# Patient Record
Sex: Female | Born: 1960 | Race: Black or African American | Hispanic: No | Marital: Single | Smoking: Never smoker
Health system: Southern US, Community
[De-identification: ages and names within clinical notes are randomized; demographics above are authoritative.]

## PROBLEM LIST (undated history)

## (undated) DIAGNOSIS — M1990 Unspecified osteoarthritis, unspecified site: Secondary | ICD-10-CM

## (undated) DIAGNOSIS — J449 Chronic obstructive pulmonary disease, unspecified: Secondary | ICD-10-CM

## (undated) DIAGNOSIS — G809 Cerebral palsy, unspecified: Secondary | ICD-10-CM

## (undated) DIAGNOSIS — R569 Unspecified convulsions: Secondary | ICD-10-CM

## (undated) HISTORY — PX: OTHER SURGICAL HISTORY: SHX169

## (undated) HISTORY — PX: NO PAST SURGERIES: SHX2092

---

## 2010-01-02 ENCOUNTER — Ambulatory Visit: Payer: Self-pay | Admitting: Family Medicine

## 2010-06-27 ENCOUNTER — Inpatient Hospital Stay: Payer: Self-pay | Admitting: Internal Medicine

## 2010-07-10 ENCOUNTER — Inpatient Hospital Stay: Payer: Self-pay | Admitting: Internal Medicine

## 2016-09-28 ENCOUNTER — Emergency Department: Payer: Medicare Other

## 2016-09-28 ENCOUNTER — Inpatient Hospital Stay
Admission: EM | Admit: 2016-09-28 | Discharge: 2016-10-04 | DRG: 871 | Disposition: A | Discharge status: Home Health | Payer: Medicare Other | Attending: Internal Medicine | Admitting: Internal Medicine

## 2016-09-28 DIAGNOSIS — A419 Sepsis, unspecified organism: Principal | ICD-10-CM | POA: Diagnosis present

## 2016-09-28 DIAGNOSIS — Z833 Family history of diabetes mellitus: Secondary | ICD-10-CM

## 2016-09-28 DIAGNOSIS — Z8249 Family history of ischemic heart disease and other diseases of the circulatory system: Secondary | ICD-10-CM

## 2016-09-28 DIAGNOSIS — Z681 Body mass index (BMI) 19 or less, adult: Secondary | ICD-10-CM

## 2016-09-28 DIAGNOSIS — L89121 Pressure ulcer of left upper back, stage 1: Secondary | ICD-10-CM | POA: Diagnosis present

## 2016-09-28 DIAGNOSIS — G800 Spastic quadriplegic cerebral palsy: Secondary | ICD-10-CM | POA: Diagnosis present

## 2016-09-28 DIAGNOSIS — E876 Hypokalemia: Secondary | ICD-10-CM | POA: Diagnosis present

## 2016-09-28 DIAGNOSIS — E870 Hyperosmolality and hypernatremia: Secondary | ICD-10-CM | POA: Diagnosis present

## 2016-09-28 DIAGNOSIS — R4182 Altered mental status, unspecified: Secondary | ICD-10-CM | POA: Diagnosis present

## 2016-09-28 DIAGNOSIS — M2450 Contracture, unspecified joint: Secondary | ICD-10-CM | POA: Diagnosis present

## 2016-09-28 DIAGNOSIS — R52 Pain, unspecified: Secondary | ICD-10-CM | POA: Diagnosis not present

## 2016-09-28 DIAGNOSIS — R509 Fever, unspecified: Secondary | ICD-10-CM

## 2016-09-28 DIAGNOSIS — G40909 Epilepsy, unspecified, not intractable, without status epilepticus: Secondary | ICD-10-CM | POA: Diagnosis present

## 2016-09-28 DIAGNOSIS — L8990 Pressure ulcer of unspecified site, unspecified stage: Secondary | ICD-10-CM | POA: Insufficient documentation

## 2016-09-28 DIAGNOSIS — K8020 Calculus of gallbladder without cholecystitis without obstruction: Secondary | ICD-10-CM

## 2016-09-28 DIAGNOSIS — K829 Disease of gallbladder, unspecified: Secondary | ICD-10-CM | POA: Diagnosis present

## 2016-09-28 DIAGNOSIS — I959 Hypotension, unspecified: Secondary | ICD-10-CM | POA: Diagnosis present

## 2016-09-28 DIAGNOSIS — R0682 Tachypnea, not elsewhere classified: Secondary | ICD-10-CM | POA: Diagnosis present

## 2016-09-28 DIAGNOSIS — J449 Chronic obstructive pulmonary disease, unspecified: Secondary | ICD-10-CM | POA: Diagnosis present

## 2016-09-28 DIAGNOSIS — E43 Unspecified severe protein-calorie malnutrition: Secondary | ICD-10-CM | POA: Insufficient documentation

## 2016-09-28 HISTORY — DX: Chronic obstructive pulmonary disease, unspecified: J44.9

## 2016-09-28 HISTORY — DX: Unspecified osteoarthritis, unspecified site: M19.90

## 2016-09-28 HISTORY — DX: Unspecified convulsions: R56.9

## 2016-09-28 LAB — URINALYSIS, ROUTINE W REFLEX MICROSCOPIC
BACTERIA UA: NONE SEEN
Glucose, UA: NEGATIVE mg/dL
HGB URINE DIPSTICK: NEGATIVE
Ketones, ur: NEGATIVE mg/dL
Leukocytes, UA: NEGATIVE
NITRITE: NEGATIVE
PH: 5 (ref 5.0–8.0)
Protein, ur: 100 mg/dL — AB

## 2016-09-28 LAB — COMPREHENSIVE METABOLIC PANEL
ALBUMIN: 3.7 g/dL (ref 3.5–5.0)
ALT: 18 U/L (ref 14–54)
AST: 25 U/L (ref 15–41)
Alkaline Phosphatase: 80 U/L (ref 38–126)
Anion gap: 10 (ref 5–15)
BUN: 15 mg/dL (ref 6–20)
CHLORIDE: 116 mmol/L — AB (ref 101–111)
CO2: 23 mmol/L (ref 22–32)
CREATININE: 0.45 mg/dL (ref 0.44–1.00)
Calcium: 9 mg/dL (ref 8.9–10.3)
GFR calc Af Amer: >60 mL/min (ref >60)
GLUCOSE: 109 mg/dL — AB (ref 65–99)
Potassium: 3.3 mmol/L — ABNORMAL LOW (ref 3.5–5.1)
Sodium: 149 mmol/L — ABNORMAL HIGH (ref 135–145)
Total Bilirubin: 0.7 mg/dL (ref 0.3–1.2)
Total Protein: 8.1 g/dL (ref 6.5–8.1)

## 2016-09-28 LAB — CBC WITH DIFFERENTIAL/PLATELET
BASOS PCT: 1 %
Basophils Absolute: 0.1 10*3/uL (ref 0–0.1)
Eosinophils Absolute: 0.3 10*3/uL (ref 0–0.7)
Eosinophils Relative: 3 %
HEMATOCRIT: 39.2 % (ref 35.0–47.0)
HEMOGLOBIN: 13.4 g/dL (ref 12.0–16.0)
LYMPHS ABS: 1.7 10*3/uL (ref 1.0–3.6)
LYMPHS PCT: 17 %
MCH: 31.1 pg (ref 26.0–34.0)
MCHC: 34.1 g/dL (ref 32.0–36.0)
MCV: 91.4 fL (ref 80.0–100.0)
MONO ABS: 1.2 10*3/uL — AB (ref 0.2–0.9)
MONOS PCT: 12 %
NEUTROS ABS: 6.8 10*3/uL — AB (ref 1.4–6.5)
Neutrophils Relative %: 67 %
Platelets: 224 10*3/uL (ref 150–440)
RBC: 4.29 MIL/uL (ref 3.80–5.20)
RDW: 14.2 % (ref 11.5–14.5)
WBC: 10 10*3/uL (ref 3.6–11.0)

## 2016-09-28 LAB — PROTIME-INR
INR: 1.07
Prothrombin Time: 13.8 seconds (ref 11.4–15.2)

## 2016-09-28 LAB — TROPONIN I: Troponin I: <0.03 ng/mL (ref <0.03)

## 2016-09-28 LAB — PROCALCITONIN: Procalcitonin: 0.23 ng/mL

## 2016-09-28 LAB — LACTIC ACID, PLASMA: LACTIC ACID, VENOUS: 0.8 mmol/L (ref 0.5–1.9)

## 2016-09-28 LAB — LIPASE, BLOOD: LIPASE: 20 U/L (ref 11–51)

## 2016-09-28 MED ORDER — IOPAMIDOL (ISOVUE-300) INJECTION 61%
75.0000 mL | Freq: Once | INTRAVENOUS | Status: AC | PRN
  Administered 2016-09-28: 60 mL via INTRAVENOUS

## 2016-09-28 MED ORDER — PIPERACILLIN-TAZOBACTAM 3.375 G IVPB 30 MIN
3.3750 g | Freq: Once | INTRAVENOUS | Status: AC
  Administered 2016-09-28: 3.375 g via INTRAVENOUS

## 2016-09-28 MED ORDER — VANCOMYCIN HCL 500 MG IV SOLR
500.0000 mg | Freq: Once | INTRAVENOUS | Status: AC
  Administered 2016-09-28: 500 mg via INTRAVENOUS
  Filled 2016-09-28: qty 500

## 2016-09-28 MED ORDER — PIPERACILLIN-TAZOBACTAM 3.375 G IVPB 30 MIN
INTRAVENOUS | Status: AC
  Filled 2016-09-28: qty 50

## 2016-09-28 MED ORDER — SODIUM CHLORIDE 0.9 % IV BOLUS (SEPSIS)
1000.0000 mL | Freq: Once | INTRAVENOUS | Status: AC
  Administered 2016-09-28: 1000 mL via INTRAVENOUS

## 2016-09-28 MED ORDER — FENTANYL CITRATE (PF) 100 MCG/2ML IJ SOLN
50.0000 ug | Freq: Once | INTRAMUSCULAR | Status: AC
  Administered 2016-09-28: 50 ug via INTRAVENOUS
  Filled 2016-09-28: qty 2

## 2016-09-28 NOTE — Progress Notes (Signed)
Pharmacy Antibiotic Note  Roberta Ramos is a 56 y.o. female admitted on 09/28/2016 with sepsis.  Pharmacy has been consulted for vancomycin dosing.  Plan: Vancomycin 500 mg IV x 1 in ED. Waiting for serum creatinine/CrCl and admission orders.  Height: 5\' 3"  (160 cm) Weight: 55 lb (24.9 kg) IBW/kg (Calculated) : 52.4  No data recorded.  No results for input(s): WBC, CREATININE, LATICACIDVEN, VANCOTROUGH, VANCOPEAK, VANCORANDOM, GENTTROUGH, GENTPEAK, GENTRANDOM, TOBRATROUGH, TOBRAPEAK, TOBRARND, AMIKACINPEAK, AMIKACINTROU, AMIKACIN in the last 168 hours.  CrCl cannot be calculated (No order found.).    Not on File  Thank you for allowing pharmacy to be a part of this patient's care.  Laural Benes, Pharm.D., BCPS Clinical Pharmacist 09/28/2016 9:45 PM

## 2016-09-28 NOTE — ED Notes (Signed)
Pt in CT.

## 2016-09-28 NOTE — ED Triage Notes (Signed)
Pt arrived POV with family members. Sister reports fever at [place] office and Dr. Michela Pitcher that she "didnt like the sound of her stomach". Pt is non-verbal. Family at the bedside.

## 2016-09-28 NOTE — ED Provider Notes (Signed)
[facility] Emergency Department Provider Note  ____________________________________________   First MD Initiated Contact with Patient 09/28/16 2137     (approximate)  I have reviewed the triage vital signs and the nursing notes.   HISTORY  Chief Complaint Fever  Level V exemption history Limited by the patient's altered mental status   HPI Roberta Ramos is a 56 y.o. female is brought to the emergency department by her family for fever and "not acting right". She became febrile 2 days ago up to 102. Family brought the patient to her primary care physician today who recommended he come to the emergency department for evaluation of abdominal pain. The patient has not had any diarrhea. She is not behaving normally according to family. She has a long-standing history of cerebral palsy as well as COPD and seizure disorder.   Past Medical History:  Diagnosis Date  . Arthritis   . COPD (chronic obstructive pulmonary disease) ([place])   . Seizures ([place])     There are no active problems to display for this patient.   History reviewed. No pertinent surgical history.  Prior to Admission medications   Not on File    Allergies Patient has no allergy information on record.  History reviewed. No pertinent family history.  Social History Social History  Substance Use Topics  . Smoking status: Never Smoker  . Smokeless tobacco: Never Used  . Alcohol use No    Review of Systems Level V exemption history Limited by the patient's clinical condition ____________________________________________   PHYSICAL EXAM:  VITAL SIGNS: ED Triage Vitals  Enc Vitals Group     BP 09/28/16 2036 (!) 167/128     Pulse Rate 09/28/16 2036 (!) 136     Resp 09/28/16 2036 19     Temp --      Temp src --      SpO2 09/28/16 2036 97 %     Weight 09/28/16 2040 55 lb (24.9 kg)     Height 09/28/16 2040 5\' 3"  (1.6 m)     Head Circumference --      Peak Flow --      Pain  Score --      Pain Loc --      Pain Edu? --      Excl. in [place]? --     Constitutional: Chronically ill appearing cachectic and contorted Eyes: PERRL EOMI. Head: Atraumatic. Nose: No congestion/rhinnorhea. Mouth/Throat: No trismus Neck: No stridor.   Cardiovascular: Tachycardic rate, regular rhythm. Grossly normal heart sounds.  Good peripheral circulation. Respiratory: Increased respiratory effort.  No retractions. Lungs CTAB and moving good air Gastrointestinal: Soft mild diffuse tenderness with no focality Musculoskeletal: No lower extremity edema  contracted upper extremities Neurologic: No gross focal neurologic deficits are appreciated. Skin:  Skin is warm, dry and intact. No rash noted.     ____________________________________________   DIFFERENTIAL includes but not limited to  Sepsis, urinary tract infection, pneumonia, appendicitis, diverticulitis ____________________________________________   LABS (all labs ordered are listed, but only abnormal results are displayed)  Labs Reviewed  COMPREHENSIVE METABOLIC PANEL - Abnormal; Notable for the following:       Result Value   Sodium 149 (*)    Potassium 3.3 (*)    Chloride 116 (*)    Glucose, Bld 109 (*)    All other components within normal limits  CBC WITH DIFFERENTIAL/PLATELET - Abnormal; Notable for the following:    Neutro Abs 6.8 (*)    Monocytes Absolute  1.2 (*)    All other components within normal limits  URINALYSIS, ROUTINE W REFLEX MICROSCOPIC - Abnormal; Notable for the following:    Color, Urine AMBER (*)    APPearance HAZY (*)    Specific Gravity, Urine >1.046 (*)    Bilirubin Urine MODERATE (*)    Protein, ur 100 (*)    Squamous Epithelial / LPF 0-5 (*)    All other components within normal limits  CULTURE, BLOOD (ROUTINE X 2)  CULTURE, BLOOD (ROUTINE X 2)  URINE CULTURE  LACTIC ACID, PLASMA  LIPASE, BLOOD  TROPONIN I  PROCALCITONIN  PROTIME-INR    Heavily concentrated urine consistent  with dehydration Elevated pro calcitonin suggestive of infection __________________________________________  EKG   ____________________________________________  RADIOLOGY  CT scan concerning for cholecystitis ____________________________________________   PROCEDURES  Procedure(s) performed: no  Procedures  Critical Care performed: yes  CRITICAL CARE Performed by: Darel Hong   Total critical care time: 35 minutes  Critical care time was exclusive of separately billable procedures and treating other patients.  Critical care was necessary to treat or prevent imminent or life-threatening deterioration.  Critical care was time spent personally by me on the following activities: development of treatment plan with patient and/or surrogate as well as nursing, discussions with consultants, evaluation of patient's response to treatment, examination of patient, obtaining history from patient or surrogate, ordering and performing treatments and interventions, ordering and review of laboratory studies, ordering and review of radiographic studies, pulse oximetry and re-evaluation of patient's condition.   Observation: no ____________________________________________   INITIAL IMPRESSION / ASSESSMENT AND PLAN / ED COURSE  Pertinent labs & imaging results that were available during my care of the patient were reviewed by me and considered in my medical decision making (see chart for details).  On arrival the patient is tachycardic, afebrile here family reported giving her antipyretics at home. Concern is for sepsis. Broad labs are pending but she will also require an in and out catheterization as well as a CT scan of her abdomen pelvis. I will treat her with Zosyn and vancomycin now for sepsis of unknown etiology.    ----------------------------------------- 11:43 PM on 09/28/2016 -----------------------------------------  The patient's CT scan came back concerning for  cholecystitis. I discussed the case with Dr. Rosana Hoes on call for general surgery who agrees with fluids and antibiotics but has requested an ultrasound to better evaluate. Care signed over to Dr. Owens Shark who will follow-up on the ultrasound.  ____________________________________________   FINAL CLINICAL IMPRESSION(S) / ED DIAGNOSES  Final diagnoses:  Pain  Cholecystitis  Sepsis, due to unspecified organism [facility])      NEW MEDICATIONS STARTED DURING THIS VISIT:  New Prescriptions   No medications on file     Note:  This document was prepared using Dragon voice recognition software and may include unintentional dictation errors.     Darel Hong, MD 09/28/16 [phone]

## 2016-09-29 ENCOUNTER — Encounter: Payer: Self-pay | Admitting: Internal Medicine

## 2016-09-29 DIAGNOSIS — Z833 Family history of diabetes mellitus: Secondary | ICD-10-CM | POA: Diagnosis not present

## 2016-09-29 DIAGNOSIS — A419 Sepsis, unspecified organism: Secondary | ICD-10-CM | POA: Diagnosis present

## 2016-09-29 DIAGNOSIS — E876 Hypokalemia: Secondary | ICD-10-CM | POA: Diagnosis present

## 2016-09-29 DIAGNOSIS — E43 Unspecified severe protein-calorie malnutrition: Secondary | ICD-10-CM | POA: Insufficient documentation

## 2016-09-29 DIAGNOSIS — K829 Disease of gallbladder, unspecified: Secondary | ICD-10-CM | POA: Diagnosis present

## 2016-09-29 DIAGNOSIS — L89121 Pressure ulcer of left upper back, stage 1: Secondary | ICD-10-CM | POA: Diagnosis present

## 2016-09-29 DIAGNOSIS — Z681 Body mass index (BMI) 19 or less, adult: Secondary | ICD-10-CM | POA: Diagnosis not present

## 2016-09-29 DIAGNOSIS — E870 Hyperosmolality and hypernatremia: Secondary | ICD-10-CM | POA: Diagnosis present

## 2016-09-29 DIAGNOSIS — R0682 Tachypnea, not elsewhere classified: Secondary | ICD-10-CM | POA: Diagnosis present

## 2016-09-29 DIAGNOSIS — Z8249 Family history of ischemic heart disease and other diseases of the circulatory system: Secondary | ICD-10-CM | POA: Diagnosis not present

## 2016-09-29 DIAGNOSIS — M2450 Contracture, unspecified joint: Secondary | ICD-10-CM | POA: Diagnosis present

## 2016-09-29 DIAGNOSIS — I959 Hypotension, unspecified: Secondary | ICD-10-CM | POA: Diagnosis present

## 2016-09-29 DIAGNOSIS — K8020 Calculus of gallbladder without cholecystitis without obstruction: Secondary | ICD-10-CM | POA: Insufficient documentation

## 2016-09-29 DIAGNOSIS — G40909 Epilepsy, unspecified, not intractable, without status epilepticus: Secondary | ICD-10-CM | POA: Diagnosis present

## 2016-09-29 DIAGNOSIS — G800 Spastic quadriplegic cerebral palsy: Secondary | ICD-10-CM | POA: Diagnosis present

## 2016-09-29 DIAGNOSIS — J449 Chronic obstructive pulmonary disease, unspecified: Secondary | ICD-10-CM | POA: Diagnosis present

## 2016-09-29 DIAGNOSIS — R52 Pain, unspecified: Secondary | ICD-10-CM | POA: Diagnosis present

## 2016-09-29 DIAGNOSIS — R4182 Altered mental status, unspecified: Secondary | ICD-10-CM | POA: Diagnosis present

## 2016-09-29 LAB — BLOOD CULTURE ID PANEL (REFLEXED)
ACINETOBACTER BAUMANNII: NOT DETECTED
CANDIDA ALBICANS: NOT DETECTED
CANDIDA GLABRATA: NOT DETECTED
CANDIDA PARAPSILOSIS: NOT DETECTED
CANDIDA TROPICALIS: NOT DETECTED
Candida krusei: NOT DETECTED
ENTEROBACTER CLOACAE COMPLEX: NOT DETECTED
ENTEROBACTERIACEAE SPECIES: NOT DETECTED
ESCHERICHIA COLI: NOT DETECTED
Enterococcus species: NOT DETECTED
Haemophilus influenzae: NOT DETECTED
KLEBSIELLA OXYTOCA: NOT DETECTED
KLEBSIELLA PNEUMONIAE: NOT DETECTED
Listeria monocytogenes: NOT DETECTED
Methicillin resistance: NOT DETECTED
Neisseria meningitidis: NOT DETECTED
PSEUDOMONAS AERUGINOSA: NOT DETECTED
Proteus species: NOT DETECTED
STREPTOCOCCUS PNEUMONIAE: NOT DETECTED
STREPTOCOCCUS PYOGENES: NOT DETECTED
Serratia marcescens: NOT DETECTED
Staphylococcus aureus (BCID): NOT DETECTED
Staphylococcus species: DETECTED — AB
Streptococcus agalactiae: NOT DETECTED
Streptococcus species: NOT DETECTED

## 2016-09-29 LAB — MAGNESIUM: Magnesium: 2.2 mg/dL (ref 1.7–2.4)

## 2016-09-29 LAB — TSH: TSH: 0.562 u[IU]/mL (ref 0.350–4.500)

## 2016-09-29 MED ORDER — BUDESONIDE 0.5 MG/2ML IN SUSP
0.5000 mg | Freq: Two times a day (BID) | RESPIRATORY_TRACT | Status: DC
  Administered 2016-09-29 – 2016-10-04 (×10): 0.5 mg via RESPIRATORY_TRACT
  Filled 2016-09-29 (×10): qty 2

## 2016-09-29 MED ORDER — IPRATROPIUM-ALBUTEROL 0.5-2.5 (3) MG/3ML IN SOLN
3.0000 mL | Freq: Once | RESPIRATORY_TRACT | Status: AC
  Administered 2016-09-29: 3 mL via RESPIRATORY_TRACT

## 2016-09-29 MED ORDER — ADULT MULTIVITAMIN LIQUID CH
15.0000 mL | Freq: Every day | ORAL | Status: DC
  Administered 2016-09-29 – 2016-10-04 (×3): 15 mL via ORAL
  Filled 2016-09-29 (×6): qty 15

## 2016-09-29 MED ORDER — DIAZEPAM 5 MG PO TABS: 5.0000 mg | ORAL_TABLET | Freq: Three times a day (TID) | ORAL | Status: DC | PRN

## 2016-09-29 MED ORDER — RANITIDINE HCL 150 MG/10ML PO SYRP
150.0000 mg | ORAL_SOLUTION | Freq: Every day | ORAL | Status: DC
  Administered 2016-09-30 – 2016-10-04 (×5): 150 mg via ORAL
  Filled 2016-09-29 (×5): qty 10

## 2016-09-29 MED ORDER — DIAZEPAM 5 MG/ML PO CONC: 5.0000 mg | Freq: Three times a day (TID) | ORAL | Status: DC | PRN

## 2016-09-29 MED ORDER — IBUPROFEN 100 MG/5ML PO SUSP
200.0000 mg | ORAL | Status: DC | PRN
  Administered 2016-09-29 – 2016-10-03 (×2): 200 mg via ORAL
  Filled 2016-09-29 (×4): qty 10

## 2016-09-29 MED ORDER — CETIRIZINE HCL 5 MG/5ML PO SOLN
5.0000 mg | Freq: Every day | ORAL | Status: DC
  Administered 2016-09-30 – 2016-10-04 (×5): 5 mg via ORAL
  Filled 2016-09-29 (×7): qty 5

## 2016-09-29 MED ORDER — LORAZEPAM 2 MG/ML IJ SOLN
1.0000 mg | Freq: Four times a day (QID) | INTRAMUSCULAR | Status: DC | PRN
  Administered 2016-09-29 – 2016-09-30 (×3): 1 mg via INTRAVENOUS
  Filled 2016-09-29 (×3): qty 1

## 2016-09-29 MED ORDER — DOCUSATE SODIUM 100 MG PO CAPS: 100.0000 mg | ORAL_CAPSULE | Freq: Two times a day (BID) | ORAL | Status: DC

## 2016-09-29 MED ORDER — POLYETHYLENE GLYCOL 3350 17 G PO PACK
17.0000 g | PACK | Freq: Every day | ORAL | Status: DC
  Administered 2016-10-01: 17 g via ORAL
  Filled 2016-09-29: qty 1

## 2016-09-29 MED ORDER — POTASSIUM CHLORIDE 20 MEQ PO PACK: 40.0000 meq | PACK | Freq: Once | ORAL | Status: DC

## 2016-09-29 MED ORDER — ONDANSETRON HCL 4 MG/2ML IJ SOLN: 4.0000 mg | Freq: Four times a day (QID) | INTRAMUSCULAR | Status: DC | PRN

## 2016-09-29 MED ORDER — RANITIDINE HCL 150 MG/10ML PO SYRP
112.5000 mg | ORAL_SOLUTION | Freq: Two times a day (BID) | ORAL | Status: DC
  Filled 2016-09-29 (×2): qty 10

## 2016-09-29 MED ORDER — SODIUM CHLORIDE 0.9 % IV SOLN
INTRAVENOUS | Status: DC
  Administered 2016-09-29 – 2016-09-30 (×3): via INTRAVENOUS

## 2016-09-29 MED ORDER — ORAL CARE MOUTH RINSE
15.0000 mL | Freq: Two times a day (BID) | OROMUCOSAL | Status: DC
  Administered 2016-09-29 – 2016-10-03 (×5): 15 mL via OROMUCOSAL

## 2016-09-29 MED ORDER — HEPARIN SODIUM (PORCINE) 5000 UNIT/ML IJ SOLN
5000.0000 [IU] | Freq: Two times a day (BID) | INTRAMUSCULAR | Status: DC
  Administered 2016-09-29 – 2016-10-04 (×9): 5000 [IU] via SUBCUTANEOUS
  Filled 2016-09-29 (×9): qty 1

## 2016-09-29 MED ORDER — DIAZEPAM 5 MG/ML IJ SOLN
2.5000 mg | Freq: Four times a day (QID) | INTRAMUSCULAR | Status: DC | PRN
Start: 2016-09-29 — End: 2016-09-29
  Filled 2016-09-29: qty 2

## 2016-09-29 MED ORDER — ACETAMINOPHEN 325 MG PO TABS: 650.0000 mg | ORAL_TABLET | Freq: Four times a day (QID) | ORAL | Status: DC | PRN

## 2016-09-29 MED ORDER — ACETAMINOPHEN 650 MG RE SUPP
650.0000 mg | Freq: Four times a day (QID) | RECTAL | Status: DC | PRN
Start: 2016-09-29 — End: 2016-10-04
  Administered 2016-09-30 – 2016-10-02 (×3): 650 mg via RECTAL
  Filled 2016-09-29 (×4): qty 1

## 2016-09-29 MED ORDER — DOCUSATE SODIUM 50 MG/5ML PO LIQD
100.0000 mg | Freq: Two times a day (BID) | ORAL | Status: DC
  Administered 2016-10-03: 100 mg via ORAL
  Filled 2016-09-29 (×11): qty 10

## 2016-09-29 MED ORDER — ENSURE ENLIVE PO LIQD
237.0000 mL | Freq: Three times a day (TID) | ORAL | Status: DC
  Administered 2016-09-29 (×3): 237 mL via ORAL

## 2016-09-29 MED ORDER — PHENYTOIN 125 MG/5ML PO SUSP
87.5000 mg | Freq: Two times a day (BID) | ORAL | Status: DC
  Administered 2016-09-29 – 2016-10-02 (×7): 87.5 mg via ORAL
  Filled 2016-09-29 (×9): qty 4

## 2016-09-29 MED ORDER — ALBUTEROL SULFATE (2.5 MG/3ML) 0.083% IN NEBU
INHALATION_SOLUTION | RESPIRATORY_TRACT | Status: AC
  Administered 2016-09-29: 2.5 mg
  Filled 2016-09-29: qty 3

## 2016-09-29 MED ORDER — LORAZEPAM 2 MG/ML IJ SOLN
INTRAMUSCULAR | Status: AC
  Administered 2016-09-29: 1 mg
  Filled 2016-09-29: qty 1

## 2016-09-29 MED ORDER — PIPERACILLIN-TAZOBACTAM 3.375 G IVPB
3.3750 g | Freq: Three times a day (TID) | INTRAVENOUS | Status: DC
  Administered 2016-09-29 – 2016-09-30 (×5): 3.375 g via INTRAVENOUS
  Filled 2016-09-29 (×4): qty 50

## 2016-09-29 MED ORDER — ENOXAPARIN SODIUM 40 MG/0.4ML ~~LOC~~ SOLN: 40.0000 mg | SUBCUTANEOUS | Status: DC

## 2016-09-29 MED ORDER — POTASSIUM CHLORIDE 10 MEQ/100ML IV SOLN
10.0000 meq | INTRAVENOUS | Status: AC
  Administered 2016-09-29 (×4): 10 meq via INTRAVENOUS
  Filled 2016-09-29 (×3): qty 100

## 2016-09-29 MED ORDER — CHLORHEXIDINE GLUCONATE 0.12 % MT SOLN
15.0000 mL | Freq: Two times a day (BID) | OROMUCOSAL | Status: DC
  Administered 2016-09-29 – 2016-10-04 (×10): 15 mL via OROMUCOSAL
  Filled 2016-09-29 (×7): qty 15

## 2016-09-29 MED ORDER — SIMETHICONE 80 MG PO CHEW
80.0000 mg | CHEWABLE_TABLET | ORAL | Status: DC | PRN
  Filled 2016-09-29: qty 1

## 2016-09-29 MED ORDER — LORATADINE 5 MG/5ML PO SYRP: 5.0000 mg | ORAL_SOLUTION | Freq: Every day | ORAL | Status: DC

## 2016-09-29 MED ORDER — MORPHINE SULFATE (PF) 2 MG/ML IV SOLN
2.0000 mg | Freq: Once | INTRAVENOUS | Status: AC
  Administered 2016-09-29: 2 mg via INTRAVENOUS
  Filled 2016-09-29: qty 1

## 2016-09-29 MED ORDER — IPRATROPIUM-ALBUTEROL 0.5-2.5 (3) MG/3ML IN SOLN
3.0000 mL | RESPIRATORY_TRACT | Status: DC | PRN
  Administered 2016-10-02: 3 mL via RESPIRATORY_TRACT
  Filled 2016-09-29: qty 3

## 2016-09-29 MED ORDER — IPRATROPIUM-ALBUTEROL 0.5-2.5 (3) MG/3ML IN SOLN
RESPIRATORY_TRACT | Status: AC
  Administered 2016-09-29: 3 mL via RESPIRATORY_TRACT
  Filled 2016-09-29: qty 3

## 2016-09-29 MED ORDER — ALBUTEROL SULFATE (2.5 MG/3ML) 0.083% IN NEBU
2.5000 mg | INHALATION_SOLUTION | Freq: Three times a day (TID) | RESPIRATORY_TRACT | Status: DC | PRN
  Administered 2016-09-29 – 2016-09-30 (×2): 2.5 mg via RESPIRATORY_TRACT
  Filled 2016-09-29 (×2): qty 3

## 2016-09-29 MED ORDER — ONDANSETRON HCL 4 MG PO TABS: 4.0000 mg | ORAL_TABLET | Freq: Four times a day (QID) | ORAL | Status: DC | PRN

## 2016-09-29 MED ORDER — ONDANSETRON HCL 4 MG/2ML IJ SOLN
4.0000 mg | Freq: Once | INTRAMUSCULAR | Status: AC
  Administered 2016-09-29: 4 mg via INTRAVENOUS
  Filled 2016-09-29: qty 2

## 2016-09-29 NOTE — ED Notes (Signed)
Pt's oxygen level 88% on RA, family states pt has hx COPD and does breathing trx at home, EDP notified of this, see MAR for follow up.

## 2016-09-29 NOTE — Progress Notes (Signed)
PHARMACY - PHYSICIAN COMMUNICATION CRITICAL VALUE ALERT - BLOOD CULTURE IDENTIFICATION (BCID)  Results for orders placed or performed during the hospital encounter of 09/28/16  Blood Culture ID Panel (Reflexed) (Collected: 09/28/2016  9:10 PM)  Result Value Ref Range   Enterococcus species NOT DETECTED NOT DETECTED   Listeria monocytogenes NOT DETECTED NOT DETECTED   Staphylococcus species DETECTED (A) NOT DETECTED   Staphylococcus aureus NOT DETECTED NOT DETECTED   Methicillin resistance NOT DETECTED NOT DETECTED   Streptococcus species NOT DETECTED NOT DETECTED   Streptococcus agalactiae NOT DETECTED NOT DETECTED   Streptococcus pneumoniae NOT DETECTED NOT DETECTED   Streptococcus pyogenes NOT DETECTED NOT DETECTED   Acinetobacter baumannii NOT DETECTED NOT DETECTED   Enterobacteriaceae species NOT DETECTED NOT DETECTED   Enterobacter cloacae complex NOT DETECTED NOT DETECTED   Escherichia coli NOT DETECTED NOT DETECTED   Klebsiella oxytoca NOT DETECTED NOT DETECTED   Klebsiella pneumoniae NOT DETECTED NOT DETECTED   Proteus species NOT DETECTED NOT DETECTED   Serratia marcescens NOT DETECTED NOT DETECTED   Haemophilus influenzae NOT DETECTED NOT DETECTED   Neisseria meningitidis NOT DETECTED NOT DETECTED   Pseudomonas aeruginosa NOT DETECTED NOT DETECTED   Candida albicans NOT DETECTED NOT DETECTED   Candida glabrata NOT DETECTED NOT DETECTED   Candida krusei NOT DETECTED NOT DETECTED   Candida parapsilosis NOT DETECTED NOT DETECTED   Candida tropicalis NOT DETECTED NOT DETECTED    Name of physician (or Provider) Contacted: Hugelmeyer  Changes to prescribed antibiotics required: none (patient on Zosyn).  1 bottle of 4 (anaerobic) positive for Staph species, MecA negative.  Jarmel Linhardt A 09/29/2016  10:21 PM

## 2016-09-29 NOTE — Progress Notes (Signed)
Patient's family members at bedside describes no abdominal pain that she can discern.  Abdomen is soft nontender slightly distended no icterus no jaundice vital signs are stable  No current evidence of acute cholecystitis. I agree that surgical intervention would be difficult in this patient with CT and contractures with abnormal chest wall. This would make ventilation very difficult intraoperative postop. I would not recommend any surgical intervention at this time. Available if needed.

## 2016-09-29 NOTE — H&P (Signed)
Roberta Ramos is an 56 y.o. female.   Chief Complaint: Fever HPI: The patient with past medical history of cerebral palsy, COPD and seizure disorder presents to the emergency department after he evaluated her primary care physician for fever. Her sister who is the primary caregiver reports fever of 100.83F oral. Upon physical exam her primary care doctor was concerned about something she "heard on abdominal exam". This prompted evaluation in the emergency department for acute intra-abdominal process due to apparent right upper quadrant pain. CT scan was negative for acute process. Patient has not had diarrhea. Ultrasound of the right upper quadrant showed some gallbladder sludging. She was evaluated by surgical service who determined there was no need for surgical intervention. The patient had Tylenol on the way to the emergency department and is now afebrile. She intermittently meets criteria for sepsis. Thus the emergency department staff called the hospitalist service for further evaluation.  Past Medical History:  Diagnosis Date  . Arthritis   . COPD (chronic obstructive pulmonary disease) ([name])   . Seizures ([name])     Past Surgical History:  Procedure Laterality Date  . none      Family History  Problem Relation Age of Onset  . Hypertension Mother   . Diabetes Mellitus II Brother    Social History:  reports that she has never smoked. She has never used smokeless tobacco. She reports that she does not drink alcohol or use drugs.  Allergies: Not on File  Prior to Admission medications   Not on File     Results for orders placed or performed during the hospital encounter of 09/28/16 (from the past 48 hour(s))  Lactic acid, plasma     Status: None   Collection Time: 09/28/16  9:10 PM  Result Value Ref Range   Lactic Acid, Venous 0.8 0.5 - 1.9 mmol/L  Comprehensive metabolic panel     Status: Abnormal   Collection Time: 09/28/16  9:10 PM  Result Value Ref Range   Sodium 149  (H) 135 - 145 mmol/L   Potassium 3.3 (L) 3.5 - 5.1 mmol/L   Chloride 116 (H) 101 - 111 mmol/L   CO2 23 22 - 32 mmol/L   Glucose, Bld 109 (H) 65 - 99 mg/dL   BUN 15 6 - 20 mg/dL   Creatinine, Ser 0.45 0.44 - 1.00 mg/dL   Calcium 9.0 8.9 - 10.3 mg/dL   Total Protein 8.1 6.5 - 8.1 g/dL   Albumin 3.7 3.5 - 5.0 g/dL   AST 25 15 - 41 U/L   ALT 18 14 - 54 U/L   Alkaline Phosphatase 80 38 - 126 U/L   Total Bilirubin 0.7 0.3 - 1.2 mg/dL   GFR calc non Af Amer >60 >60 mL/min   GFR calc Af Amer >60 >60 mL/min    Comment: (NOTE) The eGFR has been calculated using the CKD EPI equation. This calculation has not been validated in all clinical situations. eGFR's persistently <60 mL/min signify possible Chronic Kidney Disease.    Anion gap 10 5 - 15  Lipase, blood     Status: None   Collection Time: 09/28/16  9:10 PM  Result Value Ref Range   Lipase 20 11 - 51 U/L  Troponin I     Status: None   Collection Time: 09/28/16  9:10 PM  Result Value Ref Range   Troponin I <0.03 <0.03 ng/mL  CBC WITH DIFFERENTIAL     Status: Abnormal   Collection Time: 09/28/16  9:10  PM  Result Value Ref Range   WBC 10.0 3.6 - 11.0 K/uL   RBC 4.29 3.80 - 5.20 MIL/uL   Hemoglobin 13.4 12.0 - 16.0 g/dL   HCT 39.2 35.0 - 47.0 %   MCV 91.4 80.0 - 100.0 fL   MCH 31.1 26.0 - 34.0 pg   MCHC 34.1 32.0 - 36.0 g/dL   RDW 14.2 11.5 - 14.5 %   Platelets 224 150 - 440 K/uL   Neutrophils Relative % 67 %   Neutro Abs 6.8 (H) 1.4 - 6.5 K/uL   Lymphocytes Relative 17 %   Lymphs Abs 1.7 1.0 - 3.6 K/uL   Monocytes Relative 12 %   Monocytes Absolute 1.2 (H) 0.2 - 0.9 K/uL   Eosinophils Relative 3 %   Eosinophils Absolute 0.3 0 - 0.7 K/uL   Basophils Relative 1 %   Basophils Absolute 0.1 0 - 0.1 K/uL  Procalcitonin     Status: None   Collection Time: 09/28/16  9:10 PM  Result Value Ref Range   Procalcitonin 0.23 ng/mL    Comment:        Interpretation: PCT (Procalcitonin) <= 0.5 ng/mL: Systemic infection (sepsis) is  not likely. Local bacterial infection is possible. (NOTE)         ICU PCT Algorithm               Non ICU PCT Algorithm    ----------------------------     ------------------------------         PCT < 0.25 ng/mL                 PCT < 0.1 ng/mL     Stopping of antibiotics            Stopping of antibiotics       strongly encouraged.               strongly encouraged.    ----------------------------     ------------------------------       PCT level decrease by               PCT < 0.25 ng/mL       >= 80% from peak PCT       OR PCT 0.25 - 0.5 ng/mL          Stopping of antibiotics                                             encouraged.     Stopping of antibiotics           encouraged.    ----------------------------     ------------------------------       PCT level decrease by              PCT >= 0.25 ng/mL       < 80% from peak PCT        AND PCT >= 0.5 ng/mL            Continuin g antibiotics                                              encouraged.       Continuing antibiotics  encouraged.    ----------------------------     ------------------------------     PCT level increase compared          PCT > 0.5 ng/mL         with peak PCT AND          PCT >= 0.5 ng/mL             Escalation of antibiotics                                          strongly encouraged.      Escalation of antibiotics        strongly encouraged.   Protime-INR     Status: None   Collection Time: 09/28/16  9:10 PM  Result Value Ref Range   Prothrombin Time 13.8 11.4 - 15.2 seconds   INR 1.07   Urinalysis, Routine w reflex microscopic     Status: Abnormal   Collection Time: 09/28/16  9:10 PM  Result Value Ref Range   Color, Urine AMBER (A) YELLOW    Comment: BIOCHEMICALS MAY BE AFFECTED BY COLOR   APPearance HAZY (A) CLEAR   Specific Gravity, Urine >1.046 (H) 1.005 - 1.030   pH 5.0 5.0 - 8.0   Glucose, UA NEGATIVE NEGATIVE mg/dL   Hgb urine dipstick NEGATIVE NEGATIVE   Bilirubin Urine  MODERATE (A) NEGATIVE   Ketones, ur NEGATIVE NEGATIVE mg/dL   Protein, ur 100 (A) NEGATIVE mg/dL   Nitrite NEGATIVE NEGATIVE   Leukocytes, UA NEGATIVE NEGATIVE   RBC / HPF 0-5 0 - 5 RBC/hpf   WBC, UA 0-5 0 - 5 WBC/hpf   Bacteria, UA NONE SEEN NONE SEEN   Squamous Epithelial / LPF 0-5 (A) NONE SEEN   Mucus PRESENT    Ct Abdomen Pelvis W Contrast  Result Date: 09/28/2016 CLINICAL DATA:  Fever.  Nonverbal patient. EXAM: CT ABDOMEN AND PELVIS WITH CONTRAST TECHNIQUE: Multidetector CT imaging of the abdomen and pelvis was performed using the standard protocol following bolus administration of intravenous contrast. CONTRAST:  98m ISOVUE-300 IOPAMIDOL (ISOVUE-300) INJECTION 61% COMPARISON:  None. FINDINGS: Lower chest: No lung base consolidation.  No pleural effusions. Hepatobiliary: No focal liver lesions. Moderate gallbladder distention. Mild to moderate gallbladder mural thickening. Cannot exclude cholecystitis. Pancreas: Probably normal.  No peripancreatic inflammation. Spleen: Normal in size without focal abnormality. Adrenals/Urinary Tract: Adrenal glands are unremarkable. Kidneys are normal, without renal calculi, focal lesion, or hydronephrosis. Bladder is unremarkable. Stomach/Bowel: Stomach is within normal limits. Appendix appears normal. No evidence of bowel wall thickening, distention, or inflammatory changes. Vascular/Lymphatic: No significant vascular findings are present. No enlarged abdominal or pelvic lymph nodes. Reproductive: Uterus and bilateral adnexa are unremarkable. Other: No abscess or drainable fluid collection. No extraluminal gas. No obstruction or inflammation of bowel. Musculoskeletal: Severe scoliosis.  No focal bone abnormality. IMPRESSION: Moderate gallbladder distention and gallbladder mural thickening. Cannot exclude cholecystitis. Electronically Signed   By: DAndreas NewportM.D.   On: 09/28/2016 23:31   Dg Chest Port 1 View  Result Date: 09/28/2016 CLINICAL DATA:   Fever today. EXAM: PORTABLE CHEST 1 VIEW COMPARISON:  07/11/2010 FINDINGS: A single AP portable view of the chest demonstrates no focal airspace consolidation or alveolar edema. The lungs are grossly clear. There is no large effusion or pneumothorax. Cardiac and mediastinal contours appear unchanged. Moderate unchanged scoliosis. IMPRESSION: No active disease. Electronically Signed  By: Andreas Newport M.D.   On: 09/28/2016 21:18   US Abdomen Limited Ruq  Result Date: 09/29/2016 CLINICAL DATA:  Abdominal pain EXAM: ULTRASOUND ABDOMEN LIMITED RIGHT UPPER QUADRANT COMPARISON:  CT 09/28/2016 FINDINGS: Gallbladder: Dilated/the long gated. Sludge in the gallbladder. Possible tumefactive sludge or polyps. Mild wall thickening up to 4.2 mm. Difficult assessment of Percell Miller sign due to nonverbal patient Common bile duct: Diameter: Normal at 2.3 mm Liver: No focal lesion identified. Within normal limits in parenchymal echogenicity. Portal vein is patent on color Doppler imaging with normal direction of blood flow towards the liver. IMPRESSION: Gallbladder containing sludge and either nonshadowing stones, polyps or tumefactive sludge. Mild wall thickening at 4 mm which is nonspecific, findings could be secondary to acute or chronic cholecystitis, or liver disease. No biliary dilatation. Electronically Signed   By: Donavan Foil M.D.   On: 09/29/2016 01:32    Review of Systems  Unable to perform ROS: Patient nonverbal    Blood pressure 102/72, pulse 94, resp. rate (!) 22, height '5\' 3"'  (1.6 m), weight 24.9 kg (55 lb), SpO2 93 %. Physical Exam  Vitals reviewed. Constitutional: She is oriented to person, place, and time. She appears well-developed and well-nourished.  HENT:  Head: Normocephalic and atraumatic.  Mouth/Throat: Oropharynx is clear and moist.  Eyes: Pupils are equal, round, and reactive to light. Conjunctivae and EOM are normal. No scleral icterus.  Neck: Normal range of motion. Neck supple. No  JVD present. No tracheal deviation present. No thyromegaly present.  Cardiovascular: Normal rate, regular rhythm and normal heart sounds.  Exam reveals no gallop and no friction rub.   No murmur heard. Respiratory: Effort normal and breath sounds normal.  GI: Soft. Bowel sounds are normal. She exhibits no distension. There is no tenderness.  Genitourinary:  Genitourinary Comments: deferred  Musculoskeletal: She exhibits deformity (contractures of upper and lower extremities bilaterally). She exhibits no edema.  Lymphadenopathy:    She has no cervical adenopathy.  Neurological: She is alert and oriented to person, place, and time. No cranial nerve deficit. She exhibits normal muscle tone.  Skin: Skin is warm and dry. No rash noted. No erythema.  Psychiatric:  Patient is non-verbal and does not have meaningful responses      Assessment/Plan This is a 56 year old female admitted for sepsis. 1. Sepsis: The patient meets criteria via tachycardia and tachypnea. She is hemodynamically stable and does not consistently meet sepsis criteria at this time. However she has been loaded with broad-spectrum antibiotics following blood cultures. Monitor for stability. Follow-up cultures for growth and sensitivities. 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 The patient is nonverbal and eats a pured diet. 3. Epilepsy: No seizure activity with this illness. Continue Dilantin 4. Hypokalemia: Replete potassium 5. DVT prophylaxis: Lovenox 6. GI prophylaxis: Continue PPI The patient is a full code. Time spent on admission orders and patient care approximately 45 minutes  Harrie Foreman, MD 09/29/2016, 4:45 AM

## 2016-09-29 NOTE — Progress Notes (Signed)
Initial Nutrition Assessment  DOCUMENTATION CODES:   Severe malnutrition in context of chronic illness  INTERVENTION:   Ensure Enlive po TID, each supplement provides 350 kcal and 20 grams of protein  Magic cup TID with meals, each supplement provides 290 kcal and 9 grams of protein  MVI  DYS 1/thin liquid diet  NUTRITION DIAGNOSIS:   Malnutrition (severe) related to chronic illness (cerebral palsy, COPD) as evidenced by severe depletion of muscle mass, severe depletion of body fat  GOAL:   Patient will meet greater than or equal to 90% of their needs  MONITOR:   PO intake, Supplement acceptance, Labs, Weight trends  REASON FOR ASSESSMENT:   Other (Comment) (Low BMI)    ASSESSMENT:   56 y/o female with past medical history of cerebral palsy, COPD and seizure disorder presents to the emergency department after he evaluated her primary care physician for fever. Pt found to have sepsis and gallbladder sludging    Visited pt's room today. Pt is non verbal so history obtained from pt's sister at bedside. Sister reports that pt is a good eater at home; pt eats a pureed diet and drinks Ensure. There is no wt history for this pt but sister reports that pt is weight stable. Pt is severely cachexic and has contracture of all four extremities. It is expected that pt would have muscle atrophy (sarcopenia) r/t her cerebral palsy but pt also with severe fat depletions in arms and chest and moderate muscle depletions in her temporal regions; pt likely with malnutrition. RD suspects that pt may not be meeting her estimated protein needs r/t increased needs from CP and COPD. RD will order supplements to help pt meet estimated needs. Pt with hypokalemia that is being supplemented.    Medications reviewed and include: colace, lovenox, zosyn, KCl  Labs reviewed: Na 149(H), K 3.3(L)  Nutrition-Focused physical exam completed. Findings are severe fat depletions in arms and chest, severe muscle  depletions over entire body, moderate muscle depletions in temporal regions and no edema. Pt with significant contracture of all four of her extremities and severe sarcopenia.   Diet Order:  DIET - DYS 1 Room service appropriate? No; Fluid consistency: Thin  Skin:  Reviewed, no issues  Last BM:  8/28  Height:   Ht Readings from Last 1 Encounters:  09/28/16 5\' 3"  (1.6 m)    Weight:   Wt Readings from Last 1 Encounters:  09/28/16 55 lb (24.9 kg)    Ideal Body Weight:  47 kg (adjusted for CP)  BMI:  Body mass index is 9.74 kg/m.  Estimated Nutritional Needs:   Kcal:  900-1100kcal/day  Protein:  50-55g/day   Fluid:  1L/day   EDUCATION NEEDS:   Education needs no appropriate at this time  Koleen Distance MS, RD, [place] Pager #[phone] After Hours Pager: [phone]

## 2016-09-29 NOTE — Progress Notes (Signed)
ANTIBIOTIC CONSULT NOTE - INITIAL  Pharmacy Consult for Zosyn  Indication: sepsis  No Known Allergies  Patient Measurements: Height: 5\' 3"  (160 cm) Weight: 55 lb (24.9 kg) IBW/kg (Calculated) : 52.4 Adjusted Body Weight:   Vital Signs: Temp: 98.6 F (37 C) (08/29 0545) Temp Source: Oral (08/29 0545) BP: 120/70 (08/29 0545) Pulse Rate: 101 (08/29 0545) Intake/Output from previous day: 08/28 0701 - 08/29 0700 In: 50 [IV Piggyback:50] Out: -  Intake/Output from this shift: Total I/O In: 50 [IV Piggyback:50] Out: -   Labs:  Recent Labs  09/28/16 2110  WBC 10.0  HGB 13.4  PLT 224  CREATININE 0.45   Estimated Creatinine Clearance: 30.9 mL/min (by C-G formula based on SCr of 0.45 mg/dL). No results for input(s): VANCOTROUGH, VANCOPEAK, VANCORANDOM, GENTTROUGH, GENTPEAK, GENTRANDOM, TOBRATROUGH, TOBRAPEAK, TOBRARND, AMIKACINPEAK, AMIKACINTROU, AMIKACIN in the last 72 hours.   Microbiology: No results found for this or any previous visit (from the past 720 hour(s)).  Medical History: Past Medical History:  Diagnosis Date  . Arthritis   . COPD (chronic obstructive pulmonary disease) ([place])   . Seizures ([facility])     Medications:  Scheduled:  . docusate sodium  100 mg Oral BID  . enoxaparin (LOVENOX) injection  40 mg Subcutaneous Q24H   Assessment: CrCl = 30.9 ml/min   Goal of Therapy:  resolution of infection  Plan:  Expected duration 7 days with resolution of temperature and/or normalization of WBC   Zosyn 3.375 gm IV Q8H EI ordered to start 8/29 @ 0600 .  Kseniya Grunden D 09/29/2016,5:46 AM

## 2016-09-29 NOTE — ED Notes (Signed)
Pt checked for urine on pad, pt with hx incontinence. No urine on pad, family in room also checked with this RN.

## 2016-09-29 NOTE — Progress Notes (Signed)
[facility] at [place] NAME: Roberta Ramos    MR#:  [id number]  DATE OF BIRTH:  1960/04/08  SUBJECTIVE:  CHIEF COMPLAINT:   Chief Complaint  Patient presents with  . Fever   The patient is nonverbal. She is agitated per RN. REVIEW OF SYSTEMS:  Review of Systems  Unable to perform ROS: Patient nonverbal    DRUG ALLERGIES:  No Known Allergies VITALS:  Blood pressure 109/70, pulse 95, temperature 98.4 F (36.9 C), temperature source Axillary, resp. rate 14, height 5\' 3"  (1.6 m), weight 55 lb (24.9 kg), SpO2 95 %. PHYSICAL EXAMINATION:  Physical Exam  Constitutional: No distress.  Severe malnutrition  HENT:  Head: Normocephalic.  Eyes: Pupils are equal, round, and reactive to light. Conjunctivae and EOM are normal. No scleral icterus.  Cardiovascular: Normal rate, regular rhythm and normal heart sounds.  Exam reveals no gallop.   No murmur heard. Pulmonary/Chest: Effort normal and breath sounds normal. No stridor. No respiratory distress. She has no wheezes. She has no rales.  Abdominal: Soft. Bowel sounds are normal. She exhibits no distension. There is no tenderness. There is no rebound.  Musculoskeletal: Normal range of motion. She exhibits no edema or tenderness.  Neurological:  The patient is nonverbal,contracted extremities. Unable to exam.  Skin: No rash noted. No erythema.  Psychiatric: Affect normal.   LABORATORY PANEL:  Female CBC  Recent Labs Lab 09/28/16 2110  WBC 10.0  HGB 13.4  HCT 39.2  PLT 224   ------------------------------------------------------------------------------------------------------------------ Chemistries   Recent Labs Lab 09/28/16 2110 09/29/16 1127  NA 149*  --   K 3.3*  --   CL 116*  --   CO2 23  --   GLUCOSE 109*  --   BUN 15  --   CREATININE 0.45  --   CALCIUM 9.0  --   MG  --  2.2  AST 25  --   ALT 18  --   ALKPHOS 80  --   BILITOT 0.7  --    RADIOLOGY:  Ct Abdomen  Pelvis W Contrast  Result Date: 09/28/2016 CLINICAL DATA:  Fever.  Nonverbal patient. EXAM: CT ABDOMEN AND PELVIS WITH CONTRAST TECHNIQUE: Multidetector CT imaging of the abdomen and pelvis was performed using the standard protocol following bolus administration of intravenous contrast. CONTRAST:  24mL ISOVUE-300 IOPAMIDOL (ISOVUE-300) INJECTION 61% COMPARISON:  None. FINDINGS: Lower chest: No lung base consolidation.  No pleural effusions. Hepatobiliary: No focal liver lesions. Moderate gallbladder distention. Mild to moderate gallbladder mural thickening. Cannot exclude cholecystitis. Pancreas: Probably normal.  No peripancreatic inflammation. Spleen: Normal in size without focal abnormality. Adrenals/Urinary Tract: Adrenal glands are unremarkable. Kidneys are normal, without renal calculi, focal lesion, or hydronephrosis. Bladder is unremarkable. Stomach/Bowel: Stomach is within normal limits. Appendix appears normal. No evidence of bowel wall thickening, distention, or inflammatory changes. Vascular/Lymphatic: No significant vascular findings are present. No enlarged abdominal or pelvic lymph nodes. Reproductive: Uterus and bilateral adnexa are unremarkable. Other: No abscess or drainable fluid collection. No extraluminal gas. No obstruction or inflammation of bowel. Musculoskeletal: Severe scoliosis.  No focal bone abnormality. IMPRESSION: Moderate gallbladder distention and gallbladder mural thickening. Cannot exclude cholecystitis. Electronically Signed   By: Andreas Newport M.D.   On: 09/28/2016 23:31   Dg Chest Port 1 View  Result Date: 09/28/2016 CLINICAL DATA:  Fever today. EXAM: PORTABLE CHEST 1 VIEW COMPARISON:  07/11/2010 FINDINGS: A single AP portable view of the chest demonstrates no focal airspace consolidation  or alveolar edema. The lungs are grossly clear. There is no large effusion or pneumothorax. Cardiac and mediastinal contours appear unchanged. Moderate unchanged scoliosis.  IMPRESSION: No active disease. Electronically Signed   By: Andreas Newport M.D.   On: 09/28/2016 21:18   US Abdomen Limited Ruq  Result Date: 09/29/2016 CLINICAL DATA:  Abdominal pain EXAM: ULTRASOUND ABDOMEN LIMITED RIGHT UPPER QUADRANT COMPARISON:  CT 09/28/2016 FINDINGS: Gallbladder: Dilated/the long gated. Sludge in the gallbladder. Possible tumefactive sludge or polyps. Mild wall thickening up to 4.2 mm. Difficult assessment of Percell Miller sign due to nonverbal patient Common bile duct: Diameter: Normal at 2.3 mm Liver: No focal lesion identified. Within normal limits in parenchymal echogenicity. Portal vein is patent on color Doppler imaging with normal direction of blood flow towards the liver. IMPRESSION: Gallbladder containing sludge and either nonshadowing stones, polyps or tumefactive sludge. Mild wall thickening at 4 mm which is nonspecific, findings could be secondary to acute or chronic cholecystitis, or liver disease. No biliary dilatation. Electronically Signed   By: Donavan Foil M.D.   On: 09/29/2016 01:32   ASSESSMENT AND PLAN:   This is a 56 year old female admitted for sepsis. 1. Sepsis:  Continue Zosyn and follow-up blood cultures.  Per Dr. Burt Knack, No current evidence of acute cholecystitis. would not recommend any surgical intervention at this time.  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 The patient is nonverbal and eats a pured diet.  3. Epilepsy: No seizure activity with this illness. Continue Dilantin 4. Hypokalemia: Repleted potassium, Follow-up level. 5. Severe malnutrition. Follow dietitian's recommendation. All the records are reviewed and case discussed with Care Management/Social Worker. Management plans discussed with the patient's sister and she is in agreement.  CODE STATUS: Full Code  TOTAL TIME TAKING CARE OF THIS PATIENT: 32 minutes.   More than 50% of the time was spent in counseling/coordination of  care: YES  POSSIBLE D/C IN 2 DAYS, DEPENDING ON CLINICAL CONDITION.   Demetrios Loll M.D on 09/29/2016 at 2:09 PM  Between 7am to 6pm - Pager - [phone]  After 6pm go to [email or web address] - [facility]

## 2016-09-29 NOTE — Consult Note (Signed)
SURGICAL CONSULTATION NOTE (initial) - cpt: 99244  HISTORY OF PRESENT ILLNESS (HPI):  56 y.o. non-verbal female due to cerebral palsy presented to [facility] ED last night for fever x 2 days up to 102F per EMR, though family caregiver with whom patient lives report T max of 100.1 F. Because patient is non-verbal, history is provided by patient's family/caregiver at bedside. Patient's family states they brought patient to her PMD due to temp of 100.1, at which time her PMD auscultated patient's abdomen and referred patient to ED for further workup. CT was performed, followed by RUQ abdominal ultrasound. During RUQ ultrasound, family describes patient appeared somewhat uncomfortable, but otherwise has not demonstrated any evidence of abdominal pain, post-prandial or otherwise.  Surgery is consulted by ED physician Dr. Owens Shark in this context for evaluation and management of biliary sludge.  PAST MEDICAL HISTORY (PMH):  Past Medical History:  Diagnosis Date  . Arthritis   . COPD (chronic obstructive pulmonary disease) ([place])   . Seizures ([place])      PAST SURGICAL HISTORY ([place]):  History reviewed. No pertinent surgical history.   MEDICATIONS:  Prior to Admission medications   Not on File     ALLERGIES:  Not on File   SOCIAL HISTORY:  Social History   Social History  . Marital status: Single    Spouse name: N/A  . Number of children: N/A  . Years of education: N/A   Occupational History  . Not on file.   Social History Main Topics  . Smoking status: Never Smoker  . Smokeless tobacco: Never Used  . Alcohol use No  . Drug use: No  . Sexual activity: Not on file   Other Topics Concern  . Not on file   Social History Narrative  . No narrative on file    FAMILY HISTORY:  History reviewed. No pertinent family history.   REVIEW OF SYSTEMS: Obtained as per HPI entirely from family/caregiver at bedside due to patient's non-verbal status  VITAL SIGNS:  Pulse Rate:  [99-136] 99  (08/29 0200) Resp:  [19-40] 25 (08/29 0200) BP: (100-172)/(71-128) 100/71 (08/29 0200) SpO2:  [92 %-97 %] 92 % (08/29 0200) Weight:  [55 lb (24.9 kg)] 55 lb (24.9 kg) (08/28 2040)     Height: 5\' 3"  (160 cm) Weight: 55 lb (24.9 kg) BMI (Calculated): 9.75   INTAKE/OUTPUT:  This shift: Total I/O In: 50 [IV Piggyback:50] Out: -   Last 2 shifts: @IOLAST2SHIFTS @   PHYSICAL EXAM:  Constitutional:  -- Thin and contracted body habitus  -- Non-verbal in no apparent distress  Eyes:  -- Pupils equally round and reactive to light  -- No scleral icterus  Ear, nose, and throat:  -- No jugular venous distension  Pulmonary:  -- No crackles  -- Equal breath sounds bilaterally -- Breathing non-labored at rest Cardiovascular:  -- S1, S2 present  -- No pericardial rubs Gastrointestinal:  -- Abdomen soft and non-distended with no increased HR or wincing during exam to suggest tenderness to even deep palpation, no guarding/rebound  -- No abdominal masses appreciated, pulsatile or otherwise  Musculoskeletal and Integumentary:  -- Wounds or skin discoloration: None appreciated -- Extremities: B/L UE and LE FROM, hands and feet warm, no edema  Neurologic:  -- Motor function: intact and symmetric -- Sensation: unable to assess  Labs:  CBC Latest Ref Rng & Units 09/28/2016  WBC 3.6 - 11.0 K/uL 10.0  Hemoglobin 12.0 - 16.0 g/dL 13.4  Hematocrit 35.0 - 47.0 % 39.2  Platelets  150 - 440 K/uL 224   CMP Latest Ref Rng & Units 09/28/2016  Glucose 65 - 99 mg/dL 109(H)  BUN 6 - 20 mg/dL 15  Creatinine 0.44 - 1.00 mg/dL 0.45  Sodium 135 - 145 mmol/L 149(H)  Potassium 3.5 - 5.1 mmol/L 3.3(L)  Chloride 101 - 111 mmol/L 116(H)  CO2 22 - 32 mmol/L 23  Calcium 8.9 - 10.3 mg/dL 9.0  Total Protein 6.5 - 8.1 g/dL 8.1  Total Bilirubin 0.3 - 1.2 mg/dL 0.7  Alkaline Phos 38 - 126 U/L 80  AST 15 - 41 U/L 25  ALT 14 - 54 U/L 18   Imaging studies:  Limited RUQ Abdominal Ultrasound (09/29/2016) - personally  reviewed and discussed with family Gallbladder containing sludge and either nonshadowing stones, polyps or tumefactive sludge. Mild wall thickening at 4 mm which is nonspecific, findings could be secondary to acute or chronic cholecystitis, or liver disease. No biliary dilatation.  CT Abdomen and Pelvis with Contrast (09/28/2016) - personally reviewed and discussed with family Hepatobiliary: No focal liver lesions. Moderate gallbladder distention. Mild to moderate gallbladder mural thickening. Cannot exclude cholecystitis.  Pancreas: Probably normal.  No peripancreatic inflammation.  Spleen: Normal in size without focal abnormality.  Adrenals/Urinary Tract: Adrenal glands are unremarkable. Kidneys are normal, without renal calculi, focal lesion, or hydronephrosis. Bladder is unremarkable.  Stomach/Bowel: Stomach is within normal limits. Appendix appears normal. No evidence of bowel wall thickening, distention, or inflammatory changes.  Assessment/Plan: (ICD-10's: K68.20) 56 y.o. female with what appears to be asymptomatic biliary sludge with radiographically equivocal findings for cholecystitis in the absence of clinical signs/symptoms of cholecystitis, reportedly also afebrile throughout her ED evaluation for home low-grade fever <101F (though no temperatures documented in RN notes), and complicated by pertinent comorbidities including cerebral palsy, COPD, seizures disorder, and osteoarthritis.   - no indication for surgical intervention at this time   - discussed with patient's family signs/symptoms of gallbladder disease  - if concern for cholecystitis remains, check HIDA, but seems unlikely to yield much value  - otherwise, advance diet as tolerated with discharge planning as per ED  - medical management of comorbidities  All of the above findings and recommendations were discussed with the patient, her family, and Dr. Owens Shark, and all of patient's family's questions were  answered to their expressed satisfaction.  Thank you for the opportunity to participate in this patient's care.   -- Marilynne Drivers Rosana Hoes, MD, [place]: [facility] - Partnering for exceptional care. Office: [phone]

## 2016-09-30 LAB — URINE CULTURE: Culture: NO GROWTH

## 2016-09-30 LAB — BASIC METABOLIC PANEL
Anion gap: 10 (ref 5–15)
BUN: 9 mg/dL (ref 6–20)
CHLORIDE: 117 mmol/L — AB (ref 101–111)
CO2: 21 mmol/L — AB (ref 22–32)
CREATININE: 0.46 mg/dL (ref 0.44–1.00)
Calcium: 8.3 mg/dL — ABNORMAL LOW (ref 8.9–10.3)
GFR calc Af Amer: >60 mL/min (ref >60)
GFR calc non Af Amer: >60 mL/min (ref >60)
Glucose, Bld: 82 mg/dL (ref 65–99)
POTASSIUM: 3 mmol/L — AB (ref 3.5–5.1)
SODIUM: 148 mmol/L — AB (ref 135–145)

## 2016-09-30 LAB — HIV ANTIBODY (ROUTINE TESTING W REFLEX): HIV Screen 4th Generation wRfx: NONREACTIVE

## 2016-09-30 MED ORDER — PIPERACILLIN-TAZOBACTAM 3.375 G IVPB 30 MIN
3.3750 g | Freq: Three times a day (TID) | INTRAVENOUS | Status: DC
  Administered 2016-09-30 – 2016-10-03 (×10): 3.375 g via INTRAVENOUS
  Filled 2016-09-30 (×22): qty 50

## 2016-09-30 MED ORDER — SODIUM CHLORIDE 0.9% FLUSH
3.0000 mL | Freq: Two times a day (BID) | INTRAVENOUS | Status: DC
  Administered 2016-09-30 – 2016-10-03 (×8): 3 mL via INTRAVENOUS

## 2016-09-30 MED ORDER — LORAZEPAM 2 MG/ML IJ SOLN
1.0000 mg | INTRAMUSCULAR | Status: DC | PRN
  Administered 2016-09-30 – 2016-10-04 (×15): 1 mg via INTRAVENOUS
  Filled 2016-09-30 (×15): qty 1

## 2016-09-30 MED ORDER — POTASSIUM CHLORIDE 20 MEQ/15ML (10%) PO SOLN
60.0000 meq | Freq: Once | ORAL | Status: AC
  Administered 2016-09-30: 60 meq via ORAL
  Filled 2016-09-30: qty 45

## 2016-09-30 NOTE — Progress Notes (Signed)
  This patient was never in the ICU and I had never had any interaction with the patient as well as patient's chart.  It seems pharmacist discontinued  The patient's antibiotics under my name.  Spoke to the pharmacist Dorris Carnes and he acknowledges that it might be an accident or mistake.  [facility]

## 2016-09-30 NOTE — Progress Notes (Signed)
   [place] at [place] NAME: Roberta Ramos    MR#:  [id number]  DATE OF BIRTH:  07/30/1960  SUBJECTIVE:  CHIEF COMPLAINT:   Chief Complaint  Patient presents with  . Fever   The patient is nonverbal. She has no agitation per RN. REVIEW OF SYSTEMS:  Review of Systems  Unable to perform ROS: Patient nonverbal    DRUG ALLERGIES:  No Known Allergies VITALS:  Blood pressure (!) 168/93, pulse (!) 127, temperature 98.2 F (36.8 C), temperature source Axillary, resp. rate (!) 22, height 5\' 3"  (1.6 m), weight 55 lb 12.8 oz (25.3 kg), SpO2 100 %. PHYSICAL EXAMINATION:  Physical Exam  Constitutional: No distress.  Severe malnutrition  HENT:  Head: Normocephalic.  Eyes: Pupils are equal, round, and reactive to light. Conjunctivae and EOM are normal. No scleral icterus.  Cardiovascular: Normal rate, regular rhythm and normal heart sounds.  Exam reveals no gallop.   No murmur heard. Pulmonary/Chest: Effort normal and breath sounds normal. No stridor. No respiratory distress. She has no wheezes. She has no rales.  Abdominal: Soft. Bowel sounds are normal. She exhibits no distension. There is no tenderness. There is no rebound.  Musculoskeletal: Normal range of motion. She exhibits no edema or tenderness.  Neurological:  The patient is nonverbal,contracted extremities. Unable to exam.  Skin: No rash noted. No erythema.  Psychiatric: Affect normal.   LABORATORY PANEL:  Female CBC  Recent Labs Lab 09/28/16 2110  WBC 10.0  HGB 13.4  HCT 39.2  PLT 224   ------------------------------------------------------------------------------------------------------------------ Chemistries   Recent Labs Lab 09/28/16 2110 09/29/16 1127 09/30/16 0529  NA 149*  --  148*  K 3.3*  --  3.0*  CL 116*  --  117*  CO2 23  --  21*  GLUCOSE 109*  --  82  BUN 15  --  9  CREATININE 0.45  --  0.46  CALCIUM 9.0  --  8.3*  MG  --  2.2  --   AST 25  --    --   ALT 18  --   --   ALKPHOS 80  --   --   BILITOT 0.7  --   --    RADIOLOGY:  No results found. ASSESSMENT AND PLAN:   This is a 56 year old female admitted for sepsis. 1. Sepsis:  Continue Zosyn and follow-up blood cultures.  Per Dr. Burt Knack, No current evidence of acute cholecystitis. would not recommend any surgical intervention at this time.  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 The patient is nonverbal and eats a pured diet.  3. Epilepsy: No seizure activity with this illness. Continue Dilantin 4. Hypokalemia: Repleted potassium, Follow-up level. 5. Severe malnutrition. Follow dietitian's recommendation.  Hypokalemia. Give potassium supplement and follow-up level. Magnesium level is normal. All the records are reviewed and case discussed with Care Management/Social Worker. Management plans discussed with the patient's sister and she is in agreement.  CODE STATUS: Full Code  TOTAL TIME TAKING CARE OF THIS PATIENT: 26 minutes.   More than 50% of the time was spent in counseling/coordination of care: YES  POSSIBLE D/C IN 1-2 DAYS, DEPENDING ON CLINICAL CONDITION.   Demetrios Loll M.D on 09/30/2016 at 2:05 PM  Between 7am to 6pm - Pager - [phone]  After 6pm go to [email or web address] - [facility]

## 2016-09-30 NOTE — Progress Notes (Signed)
Dr Ara Kussmaul notified of fever and discontinuation of antibiotics earlier today.  Orders given.

## 2016-09-30 NOTE — Progress Notes (Signed)
Ativan given to patient earlier in shift due to anxiety with agitation. Patient clenching and restless in bed. Ensure held at this time due to sleep. Patient requires feeding through syringe for Ensure.

## 2016-10-01 DIAGNOSIS — L8990 Pressure ulcer of unspecified site, unspecified stage: Secondary | ICD-10-CM | POA: Insufficient documentation

## 2016-10-01 LAB — BASIC METABOLIC PANEL
Anion gap: 8 (ref 5–15)
BUN: <5 mg/dL — ABNORMAL LOW (ref 6–20)
CHLORIDE: 113 mmol/L — AB (ref 101–111)
CO2: 23 mmol/L (ref 22–32)
Calcium: 8.1 mg/dL — ABNORMAL LOW (ref 8.9–10.3)
Creatinine, Ser: 0.52 mg/dL (ref 0.44–1.00)
GFR calc non Af Amer: >60 mL/min (ref >60)
Glucose, Bld: 101 mg/dL — ABNORMAL HIGH (ref 65–99)
POTASSIUM: 2.8 mmol/L — AB (ref 3.5–5.1)
SODIUM: 144 mmol/L (ref 135–145)

## 2016-10-01 LAB — MAGNESIUM: MAGNESIUM: 2 mg/dL (ref 1.7–2.4)

## 2016-10-01 LAB — PHOSPHORUS: PHOSPHORUS: 2.8 mg/dL (ref 2.5–4.6)

## 2016-10-01 LAB — POTASSIUM: Potassium: 3.5 mmol/L (ref 3.5–5.1)

## 2016-10-01 MED ORDER — POTASSIUM CHLORIDE 20 MEQ PO PACK
20.0000 meq | PACK | Freq: Once | ORAL | Status: AC
Start: 2016-10-01 — End: 2016-10-01
  Administered 2016-10-01: 20 meq via ORAL
  Filled 2016-10-01: qty 1

## 2016-10-01 MED ORDER — POTASSIUM CHLORIDE 10 MEQ/100ML IV SOLN
10.0000 meq | INTRAVENOUS | Status: AC
  Administered 2016-10-01 (×4): 10 meq via INTRAVENOUS
  Filled 2016-10-01 (×4): qty 100

## 2016-10-01 NOTE — Progress Notes (Signed)
RN and MD assessed pt.'s skin together and saw that pt has a bruised area with a scab in the center on her left hip. MD does not think this area is a deep tissue injury. New orders to cleanse area and apply foam dressing to site every shift. RN will rotate pt every two hours and continue to assess and monitor pt.'s skin frequently.  Roberta Ramos [employer]

## 2016-10-01 NOTE — Progress Notes (Signed)
[facility] [place] NAME: Roberta Ramos    MR#:  [id number]  DATE OF BIRTH:  July 28, 1960  SUBJECTIVE:  CHIEF COMPLAINT:   Chief Complaint  Patient presents with  . Fever   The patient is nonverbal. She has fever 102 and agitation last night per RN. REVIEW OF SYSTEMS:  Review of Systems  Unable to perform ROS: Patient nonverbal    DRUG ALLERGIES:  No Known Allergies VITALS:  Blood pressure 117/87, pulse (!) 113, temperature 98.1 F (36.7 C), temperature source Oral, resp. rate 16, height 5\' 3"  (1.6 m), weight 56 lb 8 oz (25.6 kg), SpO2 100 %. PHYSICAL EXAMINATION:  Physical Exam  Constitutional: No distress.  Severe malnutrition  HENT:  Head: Normocephalic.  Eyes: Pupils are equal, round, and reactive to light. Conjunctivae and EOM are normal. No scleral icterus.  Cardiovascular: Normal rate, regular rhythm and normal heart sounds.  Exam reveals no gallop.   No murmur heard. Pulmonary/Chest: Effort normal and breath sounds normal. No stridor. No respiratory distress. She has no wheezes. She has no rales.  Abdominal: Soft. Bowel sounds are normal. She exhibits no distension. There is no tenderness. There is no rebound.  Musculoskeletal: Normal range of motion. She exhibits no edema or tenderness.  Neurological:  The patient is nonverbal,contracted upper and lower extremities. Multiple joint contractures of the hands and foot. Unable to exam.  Skin: No rash noted. No erythema.  Psychiatric: Affect normal.   LABORATORY PANEL:  Female CBC  Recent Labs Lab 09/28/16 2110  WBC 10.0  HGB 13.4  HCT 39.2  PLT 224   ------------------------------------------------------------------------------------------------------------------ Chemistries   Recent Labs Lab 09/28/16 2110  10/01/16 0312  NA 149*  < > 144  K 3.3*  < > 2.8*  CL 116*  < > 113*  CO2 23  < > 23  GLUCOSE 109*  < > 101*  BUN 15  < > <5*  CREATININE 0.45  < >  0.52  CALCIUM 9.0  < > 8.1*  MG  --   < > 2.0  AST 25  --   --   ALT 18  --   --   ALKPHOS 80  --   --   BILITOT 0.7  --   --   < > = values in this interval not displayed. RADIOLOGY:  No results found. ASSESSMENT AND PLAN:   This is a 56 year old female admitted for sepsis. 1. Sepsis:  Continue Zosyn and follow-up blood cultures.  Per Dr. Burt Knack, No current evidence of acute cholecystitis. would not recommend any surgical intervention at this time.  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 The patient is nonverbal and eats a pured diet.  3. Epilepsy: No seizure activity with this illness. Continue Dilantin 4. Hypokalemia: Repleted potassium, potassium is still low at 2.8, given potassium IV andFollow-up level. 5. Severe malnutrition. Follow dietitian's recommendation.  All the records are reviewed and case discussed with Care Management/Social Worker. Management plans discussed with the patient's sister and she is in agreement.  CODE STATUS: Full Code  TOTAL TIME TAKING CARE OF THIS PATIENT: 26 minutes.   More than 50% of the time was spent in counseling/coordination of care: YES  POSSIBLE D/C IN 1-2 DAYS, DEPENDING ON CLINICAL CONDITION.   Demetrios Loll M.D on 10/01/2016 at 4:22 PM  Between 7am to 6pm - Pager - [phone]  After 6pm go to [email or web address] - [place]  [employer]

## 2016-10-01 NOTE — Progress Notes (Signed)
MEDICATION RELATED CONSULT NOTE - Follow up  Pharmacy Consult for Electrolyte monitoring and replacement Indication: hypokalemia  No Known Allergies  Patient Measurements: Height: 5\' 3"  (160 cm) Weight: 56 lb 8 oz (25.6 kg) (bed weight) IBW/kg (Calculated) : 52.4 Adjusted Body Weight:    Labs:  Recent Labs  09/28/16 2110 09/29/16 1127 09/30/16 0529 10/01/16 0312  WBC 10.0  --   --   --   HGB 13.4  --   --   --   HCT 39.2  --   --   --   PLT 224  --   --   --   CREATININE 0.45  --  0.46 0.52  MG  --  2.2  --  2.0  PHOS  --   --   --  2.8  ALBUMIN 3.7  --   --   --   PROT 8.1  --   --   --   AST 25  --   --   --   ALT 18  --   --   --   ALKPHOS 80  --   --   --   BILITOT 0.7  --   --   --    Lab Results  Component Value Date   K 3.5 10/01/2016   Estimated Creatinine Clearance: 31.7 mL/min (by C-G formula based on SCr of 0.52 mg/dL).    Assessment: 56 yo F with hypokalemia.  Goal of Therapy:  electrolytes WNL  Plan:  K= 2.8  Mag= 2.0  Phos= 2.8 Patient received KCL IV 40 meq total this am. Will f/u K level at 1800. And labs in am.  8/31 at 1821: K 3.5 - will order KCl 20 mEq PO x1 Will recheck labs in AM  Rayna Sexton L 10/01/2016,8:09 PM

## 2016-10-01 NOTE — Consult Note (Signed)
[facility] Infectious Disease     Reason for Consult:Fever   Referring Physician: Estanislado Spire Date of Admission:  09/28/2016   Active Problems:   Sepsis ([place])   Protein-calorie malnutrition, severe   Pressure injury of skin   HPI: Roberta Ramos is a 56 y.o. female with CP and hx seizures admitted from PCP office with fevers to 100.1. She cannot provide hx so obtained from siser who is caregiver. In ED had CT with possible gallbladder process but lfts nml. On admit wbc 10, no fever bu t on 8.30 spiked to 103. Has been on zosyn since admit.  LFTs were normal. BCX neg. She has not been eating and is aspiration risk.  Past Medical History:  Diagnosis Date  . Arthritis   . COPD (chronic obstructive pulmonary disease) ([place])   . Seizures ([place])    Past Surgical History:  Procedure Laterality Date  . none     Social History  Substance Use Topics  . Smoking status: Never Smoker  . Smokeless tobacco: Never Used  . Alcohol use No   Family History  Problem Relation Age of Onset  . Hypertension Mother   . Diabetes Mellitus II Brother     Allergies: No Known Allergies  Current antibiotics: Antibiotics Given (last 72 hours)    Date/Time Action Medication Dose Rate   09/28/16 2209 New Bag/Given   piperacillin-tazobactam (ZOSYN) IVPB 3.375 g 3.375 g 100 mL/hr   09/28/16 2223 New Bag/Given   vancomycin (VANCOCIN) 500 mg in sodium chloride 0.9 % 100 mL IVPB 500 mg 100 mL/hr   09/29/16 0600 New Bag/Given   piperacillin-tazobactam (ZOSYN) IVPB 3.375 g 3.375 g 12.5 mL/hr   09/29/16 1549 New Bag/Given   piperacillin-tazobactam (ZOSYN) IVPB 3.375 g 3.375 g 12.5 mL/hr   09/29/16 2006 New Bag/Given   piperacillin-tazobactam (ZOSYN) IVPB 3.375 g 3.375 g 12.5 mL/hr   09/30/16 0502 New Bag/Given   piperacillin-tazobactam (ZOSYN) IVPB 3.375 g 3.375 g 12.5 mL/hr   09/30/16 1307 New Bag/Given   piperacillin-tazobactam (ZOSYN) IVPB 3.375 g 3.375 g 12.5 mL/hr   09/30/16 2336 New Bag/Given    piperacillin-tazobactam (ZOSYN) IVPB 3.375 g 3.375 g 12.5 mL/hr   10/01/16 0545 New Bag/Given   piperacillin-tazobactam (ZOSYN) IVPB 3.375 g 3.375 g 12.5 mL/hr   10/01/16 1349 New Bag/Given   piperacillin-tazobactam (ZOSYN) IVPB 3.375 g 3.375 g 12.5 mL/hr      MEDICATIONS: . budesonide  0.5 mg Nebulization BID  . cetirizine HCl  5 mg Oral Daily  . chlorhexidine  15 mL Mouth Rinse BID  . docusate  100 mg Oral BID  . heparin subcutaneous  5,000 Units Subcutaneous Q12H  . mouth rinse  15 mL Mouth Rinse q12n4p  . multivitamin  15 mL Oral Daily  . phenytoin  87.5 mg Oral BID  . polyethylene glycol  17 g Oral Daily  . ranitidine  150 mg Oral Daily  . sodium chloride flush  3 mL Intravenous Q12H    Review of Systems - 11 systems reviewed and negative per HPI   OBJECTIVE: Temp:  [98.1 F (36.7 C)-103 F (39.4 C)] 98.1 F (36.7 C) (08/31 1231) Pulse Rate:  [97-141] 113 (08/31 1231) Resp:  [16-28] 16 (08/31 0524) BP: (112-164)/(57-109) 117/87 (08/31 1231) SpO2:  [97 %-100 %] 100 % (08/31 1231) Weight:  [25.3 kg (55 lb 12.8 oz)-25.6 kg (56 lb 8 oz)] 25.6 kg (56 lb 8 oz) (08/31 1154) Physical Exam  Constitutional:  Cachectic, contracted HENT: [place]/AT, PERRLA, no  scleral icterus Mouth/Throat: Oropharynx - diff to examine Cardiovascular: Normal rate, regular rhythm and normal heart sounds. Pulmonary/Chest: Effort normal  Bibasilar rhonchi Abdominal: Soft. Bowel sounds are normal.  exhibits no distension. There is no obviouys tenderness.  Lymphadenopathy: no cervical adenopathy. No axillary adenopathy Neurological: awake but non communicative  Skin: Skin is warm and dry. No rash noted. No erythema.  Psychiatric: unable to assess  LABS: Results for orders placed or performed during the hospital encounter of 09/28/16 (from the past 48 hour(s))  Basic metabolic panel     Status: Abnormal   Collection Time: 09/30/16  5:29 AM  Result Value Ref Range   Sodium 148 (H) 135 - 145 mmol/L    Potassium 3.0 (L) 3.5 - 5.1 mmol/L   Chloride 117 (H) 101 - 111 mmol/L   CO2 21 (L) 22 - 32 mmol/L   Glucose, Bld 82 65 - 99 mg/dL   BUN 9 6 - 20 mg/dL   Creatinine, Ser 0.46 0.44 - 1.00 mg/dL   Calcium 8.3 (L) 8.9 - 10.3 mg/dL   GFR calc non Af Amer >60 >60 mL/min   GFR calc Af Amer >60 >60 mL/min    Comment: (NOTE) The eGFR has been calculated using the CKD EPI equation. This calculation has not been validated in all clinical situations. eGFR's persistently <60 mL/min signify possible Chronic Kidney Disease.    Anion gap 10 5 - 15  CULTURE, BLOOD (ROUTINE X 2) w Reflex to ID Panel     Status: None (Preliminary result)   Collection Time: 09/30/16 11:12 PM  Result Value Ref Range   Specimen Description BLOOD RT ARM    Special Requests      BOTTLES DRAWN AEROBIC AND ANAEROBIC Blood Culture adequate volume   Culture NO GROWTH < 12 HOURS    Report Status PENDING   CULTURE, BLOOD (ROUTINE X 2) w Reflex to ID Panel     Status: None (Preliminary result)   Collection Time: 09/30/16 11:17 PM  Result Value Ref Range   Specimen Description BLOOD LT FOREARM    Special Requests      BOTTLES DRAWN AEROBIC AND ANAEROBIC Blood Culture adequate volume   Culture NO GROWTH < 12 HOURS    Report Status PENDING   Basic metabolic panel     Status: Abnormal   Collection Time: 10/01/16  3:12 AM  Result Value Ref Range   Sodium 144 135 - 145 mmol/L   Potassium 2.8 (L) 3.5 - 5.1 mmol/L   Chloride 113 (H) 101 - 111 mmol/L   CO2 23 22 - 32 mmol/L   Glucose, Bld 101 (H) 65 - 99 mg/dL   BUN <5 (L) 6 - 20 mg/dL   Creatinine, Ser 0.52 0.44 - 1.00 mg/dL   Calcium 8.1 (L) 8.9 - 10.3 mg/dL   GFR calc non Af Amer >60 >60 mL/min   GFR calc Af Amer >60 >60 mL/min    Comment: (NOTE) The eGFR has been calculated using the CKD EPI equation. This calculation has not been validated in all clinical situations. eGFR's persistently <60 mL/min signify possible Chronic Kidney Disease.    Anion gap 8 5 - 15   Magnesium     Status: None   Collection Time: 10/01/16  3:12 AM  Result Value Ref Range   Magnesium 2.0 1.7 - 2.4 mg/dL  Phosphorus     Status: None   Collection Time: 10/01/16  3:12 AM  Result Value Ref Range   Phosphorus 2.8 2.5 -  4.6 mg/dL   No components found for: ESR, C REACTIVE PROTEIN MICRO: Recent Results (from the past 720 hour(s))  Blood Culture (routine x 2)     Status: None (Preliminary result)   Collection Time: 09/28/16  9:10 PM  Result Value Ref Range Status   Specimen Description BLOOD RIGHT UPPER INNER ARM  Final   Special Requests   Final    BOTTLES DRAWN AEROBIC AND ANAEROBIC Blood Culture adequate volume   Culture NO GROWTH 3 DAYS  Final   Report Status PENDING  Incomplete  Blood Culture (routine x 2)     Status: Abnormal (Preliminary result)   Collection Time: 09/28/16  9:10 PM  Result Value Ref Range Status   Specimen Description BLOOD RIGHT UPPER ARM  Final   Special Requests   Final    BOTTLES DRAWN AEROBIC AND ANAEROBIC Blood Culture adequate volume   Culture  Setup Time   Final    GRAM POSITIVE COCCI ANAEROBIC BOTTLE ONLY CRITICAL RESULT CALLED TO, READ BACK BY AND VERIFIED WITH: KRISTIN MERRILL AT 2113 09/29/2016 BY TFK.    Culture (A)  Final    STAPHYLOCOCCUS SPECIES (COAGULASE NEGATIVE) THE SIGNIFICANCE OF ISOLATING THIS ORGANISM FROM A SINGLE SET OF BLOOD CULTURES WHEN MULTIPLE SETS ARE DRAWN IS UNCERTAIN. PLEASE NOTIFY THE MICROBIOLOGY DEPARTMENT WITHIN ONE WEEK IF SPECIATION AND SENSITIVITIES ARE REQUIRED. Performed at [facility], [place] [address] [place], [place] [address]    Report Status PENDING  Incomplete  Urine culture     Status: None   Collection Time: 09/28/16  9:10 PM  Result Value Ref Range Status   Specimen Description URINE, RANDOM  Final   Special Requests NONE  Final   Culture   Final    NO GROWTH Performed at [facility], [address] [place],  [address]    Report Status 09/30/2016 FINAL  Final   Blood Culture ID Panel (Reflexed)     Status: Abnormal   Collection Time: 09/28/16  9:10 PM  Result Value Ref Range Status   Enterococcus species NOT DETECTED NOT DETECTED Final   Listeria monocytogenes NOT DETECTED NOT DETECTED Final   Staphylococcus species DETECTED (A) NOT DETECTED Final    Comment: Methicillin (oxacillin) susceptible coagulase negative staphylococcus. Possible blood culture contaminant (unless isolated from more than one blood culture draw or clinical case suggests pathogenicity). No antibiotic treatment is indicated for blood  culture contaminants. CRITICAL RESULT CALLED TO, READ BACK BY AND VERIFIED WITH: KRISTIN MERRILL AT 2113 09/29/2016 BY TFK.    Staphylococcus aureus NOT DETECTED NOT DETECTED Final   Methicillin resistance NOT DETECTED NOT DETECTED Final   Streptococcus species NOT DETECTED NOT DETECTED Final   Streptococcus agalactiae NOT DETECTED NOT DETECTED Final   Streptococcus pneumoniae NOT DETECTED NOT DETECTED Final   Streptococcus pyogenes NOT DETECTED NOT DETECTED Final   Acinetobacter baumannii NOT DETECTED NOT DETECTED Final   Enterobacteriaceae species NOT DETECTED NOT DETECTED Final   Enterobacter cloacae complex NOT DETECTED NOT DETECTED Final   Escherichia coli NOT DETECTED NOT DETECTED Final   Klebsiella oxytoca NOT DETECTED NOT DETECTED Final   Klebsiella pneumoniae NOT DETECTED NOT DETECTED Final   Proteus species NOT DETECTED NOT DETECTED Final   Serratia marcescens NOT DETECTED NOT DETECTED Final   Haemophilus influenzae NOT DETECTED NOT DETECTED Final   Neisseria meningitidis NOT DETECTED NOT DETECTED Final   Pseudomonas aeruginosa NOT DETECTED NOT DETECTED Final   Candida albicans NOT DETECTED NOT DETECTED Final  Candida glabrata NOT DETECTED NOT DETECTED Final   Candida krusei NOT DETECTED NOT DETECTED Final   Candida parapsilosis NOT DETECTED NOT DETECTED Final   Candida tropicalis NOT DETECTED NOT DETECTED Final  CULTURE,  BLOOD (ROUTINE X 2) w Reflex to ID Panel     Status: None (Preliminary result)   Collection Time: 09/30/16 11:12 PM  Result Value Ref Range Status   Specimen Description BLOOD RT ARM  Final   Special Requests   Final    BOTTLES DRAWN AEROBIC AND ANAEROBIC Blood Culture adequate volume   Culture NO GROWTH < 12 HOURS  Final   Report Status PENDING  Incomplete  CULTURE, BLOOD (ROUTINE X 2) w Reflex to ID Panel     Status: None (Preliminary result)   Collection Time: 09/30/16 11:17 PM  Result Value Ref Range Status   Specimen Description BLOOD LT FOREARM  Final   Special Requests   Final    BOTTLES DRAWN AEROBIC AND ANAEROBIC Blood Culture adequate volume   Culture NO GROWTH < 12 HOURS  Final   Report Status PENDING  Incomplete    IMAGING: Ct Abdomen Pelvis W Contrast  Result Date: 09/28/2016 CLINICAL DATA:  Fever.  Nonverbal patient. EXAM: CT ABDOMEN AND PELVIS WITH CONTRAST TECHNIQUE: Multidetector CT imaging of the abdomen and pelvis was performed using the standard protocol following bolus administration of intravenous contrast. CONTRAST:  58m ISOVUE-300 IOPAMIDOL (ISOVUE-300) INJECTION 61% COMPARISON:  None. FINDINGS: Lower chest: No lung base consolidation.  No pleural effusions. Hepatobiliary: No focal liver lesions. Moderate gallbladder distention. Mild to moderate gallbladder mural thickening. Cannot exclude cholecystitis. Pancreas: Probably normal.  No peripancreatic inflammation. Spleen: Normal in size without focal abnormality. Adrenals/Urinary Tract: Adrenal glands are unremarkable. Kidneys are normal, without renal calculi, focal lesion, or hydronephrosis. Bladder is unremarkable. Stomach/Bowel: Stomach is within normal limits. Appendix appears normal. No evidence of bowel wall thickening, distention, or inflammatory changes. Vascular/Lymphatic: No significant vascular findings are present. No enlarged abdominal or pelvic lymph nodes. Reproductive: Uterus and bilateral adnexa are  unremarkable. Other: No abscess or drainable fluid collection. No extraluminal gas. No obstruction or inflammation of bowel. Musculoskeletal: Severe scoliosis.  No focal bone abnormality. IMPRESSION: Moderate gallbladder distention and gallbladder mural thickening. Cannot exclude cholecystitis. Electronically Signed   By: DAndreas NewportM.D.   On: 09/28/2016 23:31   Dg Chest Port 1 View  Result Date: 09/28/2016 CLINICAL DATA:  Fever today. EXAM: PORTABLE CHEST 1 VIEW COMPARISON:  07/11/2010 FINDINGS: A single AP portable view of the chest demonstrates no focal airspace consolidation or alveolar edema. The lungs are grossly clear. There is no large effusion or pneumothorax. Cardiac and mediastinal contours appear unchanged. Moderate unchanged scoliosis. IMPRESSION: No active disease. Electronically Signed   By: DAndreas NewportM.D.   On: 09/28/2016 21:18   UKoreaAbdomen Limited Ruq  Result Date: 09/29/2016 CLINICAL DATA:  Abdominal pain EXAM: ULTRASOUND ABDOMEN LIMITED RIGHT UPPER QUADRANT COMPARISON:  CT 09/28/2016 FINDINGS: Gallbladder: Dilated/the long gated. Sludge in the gallbladder. Possible tumefactive sludge or polyps. Mild wall thickening up to 4.2 mm. Difficult assessment of MPercell Millersign due to nonverbal patient Common bile duct: Diameter: Normal at 2.3 mm Liver: No focal lesion identified. Within normal limits in parenchymal echogenicity. Portal vein is patent on color Doppler imaging with normal direction of blood flow towards the liver. IMPRESSION: Gallbladder containing sludge and either nonshadowing stones, polyps or tumefactive sludge. Mild wall thickening at 4 mm which is nonspecific, findings could be secondary to acute or chronic  cholecystitis, or liver disease. No biliary dilatation. Electronically Signed   By: Donavan Foil M.D.   On: 09/29/2016 01:32    Assessment:   Roberta Ramos is a 56 y.o. female with fevers in patient with Cerebral palsy admitted with fevers but otherwise  unclear history. She has CT with possible gallbladder process but LFTs nml and surgery has seen. I suspect she is an aspiration risk and may have aspiration as cause of recurrent fever.  UA neg, PC< 0.5 She is on zosyn current.   Recommendations Check resp viral PCR - sister reports her niece had URI recently  If neg and remains AF would change to oral augmentin for a total 10 days of abx Thank you very much for allowing me to participate in the care of this patient. Please call with questions.   Cheral Marker. Ola Spurr, MD

## 2016-10-01 NOTE — Progress Notes (Signed)
Pharmacy note:  Discussed with RN to watch patient for any reaction to Meropenem. Patient has allergy to Penicillins with Hives and small percentage of patients can react to Meropenem.  Chinita Greenland PharmD Clinical Pharmacist 10/01/2016

## 2016-10-01 NOTE — Progress Notes (Addendum)
MEDICATION RELATED CONSULT NOTE - INITIAL   Pharmacy Consult for Electrolyte monitoring and replacement Indication: hypokalemia  No Known Allergies  Patient Measurements: Height: 5\' 3"  (160 cm) Weight: 56 lb 8 oz (25.6 kg) (bed weight) IBW/kg (Calculated) : 52.4 Adjusted Body Weight:    Labs:  Recent Labs  09/28/16 2110 09/29/16 1127 09/30/16 0529 10/01/16 0312  WBC 10.0  --   --   --   HGB 13.4  --   --   --   HCT 39.2  --   --   --   PLT 224  --   --   --   CREATININE 0.45  --  0.46 0.52  MG  --  2.2  --  2.0  PHOS  --   --   --  2.8  ALBUMIN 3.7  --   --   --   PROT 8.1  --   --   --   AST 25  --   --   --   ALT 18  --   --   --   ALKPHOS 80  --   --   --   BILITOT 0.7  --   --   --    Lab Results  Component Value Date   K 2.8 (L) 10/01/2016   Estimated Creatinine Clearance: 31.7 mL/min (by C-G formula based on SCr of 0.52 mg/dL).   Medical History: Past Medical History:  Diagnosis Date  . Arthritis   . COPD (chronic obstructive pulmonary disease) ([place])   . Seizures [facility])     Assessment: 56 yo F with hypokalemia.  Goal of Therapy:  electrolytes WNL  Plan:  K= 2.8  Mag= 2.0  Phos= 2.8 Patient received KCL IV 40 meq total this am. Will f/u K level at 1800. And labs in am.  Antoney Biven A 10/01/2016,3:33 PM

## 2016-10-01 NOTE — Progress Notes (Addendum)
Nutrition Follow Up Note   DOCUMENTATION CODES:   Severe malnutrition in context of chronic illness  INTERVENTION:   Discontinue Ensure Enlive po TID, each supplement provides 350 kcal and 20 grams of protein  Add Mighty Shake II TID with meals, each supplement provides 480-500 kcals and 20-23 grams of protein  Add [employer] Breakfast TID- each packet provides 130kcal and 5g protein   Continue Magic cup TID with meals, each supplement provides 290 kcal and 9 grams of protein  MVI  DYS 1/thin liquid diet  NUTRITION DIAGNOSIS:   Malnutrition (severe) related to chronic illness (cerebral palsy, COPD) as evidenced by severe depletion of muscle mass, severe depletion of body fat  GOAL:   Patient will meet greater than or equal to 90% of their needs  MONITOR:   PO intake, Supplement acceptance, Labs, Weight trends  ASSESSMENT:   56 y/o female with past medical history of cerebral palsy, COPD and seizure disorder presents to the emergency department after he evaluated her primary care physician for fever. Pt found to have sepsis and gallbladder sludging    Visited pt's room today. Pt is non verbal so history obtained from pt's sister at bedside. Sister reports that pt is eating small amounts of her pureed diet but not the usual amounts that she eats at home. Sister reports that it is not unusual for pt to eat less in the hospital because she does not like the food as much. Sister also reports that pt is tired of the Ensure; RD will discontinue and order Mighty Shakes and [employer] instead. Family also refusing MVI; RD encouraged this as pt is not eating well. Pt weighed in bed today; weight remains stable. Continue to encourage intake of meals and supplements. Potassium low today. Recommend check Phosphorus as pt may be refeeding.    Medications reviewed and include: colace, heparin, miralax, ranitidine, lovenox, zosyn, KCl  Labs reviewed: K 2.8(L), Cl  113(H), BUN <5(L), Mg 2.0(L)  Diet Order:  DIET - DYS 1 Room service appropriate? No; Fluid consistency: Thin  Skin:  Reviewed, no issues  Last BM:  8/29  Height:   Ht Readings from Last 1 Encounters:  09/28/16 5\' 3"  (1.6 m)    Weight:   Wt Readings from Last 1 Encounters:  10/01/16 56 lb 8 oz (25.6 kg)    Ideal Body Weight:  47 kg (adjusted for CP)  BMI:  Body mass index is 10.01 kg/m.  Estimated Nutritional Needs:   Kcal:  900-1100kcal/day  Protein:  50-55g/day   Fluid:  1L/day   EDUCATION NEEDS:   Education needs no appropriate at this time  Koleen Distance MS, RD, [place] Pager #[phone] After Hours Pager: [phone]

## 2016-10-01 NOTE — Plan of Care (Signed)
Problem: Skin Integrity: Goal: Risk for impaired skin integrity will decrease Outcome: Not Progressing Immobile and contracted. Deep tissue injury noted to left hip.  Sister at bedside and reports pressure injury not present on admission to hospital.  Sister also reports "rash to body" and swelling to left ear.  Small reddened area noted to right lateral wrist and right forearm.  Left ear folded inward while head resting on pillow.  No medical devices noted to have been previously used on ear. Foam applied to prevent injury.

## 2016-10-02 LAB — RESPIRATORY PANEL BY PCR
Adenovirus: NOT DETECTED
BORDETELLA PERTUSSIS-RVPCR: NOT DETECTED
CORONAVIRUS 229E-RVPPCR: NOT DETECTED
Chlamydophila pneumoniae: NOT DETECTED
Coronavirus HKU1: NOT DETECTED
Coronavirus NL63: NOT DETECTED
Coronavirus OC43: NOT DETECTED
INFLUENZA A-RVPPCR: NOT DETECTED
INFLUENZA B-RVPPCR: NOT DETECTED
METAPNEUMOVIRUS-RVPPCR: NOT DETECTED
Mycoplasma pneumoniae: NOT DETECTED
PARAINFLUENZA VIRUS 2-RVPPCR: NOT DETECTED
PARAINFLUENZA VIRUS 4-RVPPCR: NOT DETECTED
Parainfluenza Virus 1: NOT DETECTED
Parainfluenza Virus 3: NOT DETECTED
RESPIRATORY SYNCYTIAL VIRUS-RVPPCR: NOT DETECTED
RHINOVIRUS / ENTEROVIRUS - RVPPCR: NOT DETECTED

## 2016-10-02 LAB — PHOSPHORUS: PHOSPHORUS: 3.6 mg/dL (ref 2.5–4.6)

## 2016-10-02 LAB — CULTURE, BLOOD (ROUTINE X 2): Special Requests: ADEQUATE

## 2016-10-02 LAB — PHENYTOIN LEVEL, TOTAL: Phenytoin Lvl: 3.9 ug/mL — ABNORMAL LOW (ref 10.0–20.0)

## 2016-10-02 LAB — POTASSIUM: POTASSIUM: 3.5 mmol/L (ref 3.5–5.1)

## 2016-10-02 MED ORDER — PHENYTOIN 125 MG/5ML PO SUSP
200.0000 mg | Freq: Two times a day (BID) | ORAL | Status: DC
  Filled 2016-10-02: qty 8

## 2016-10-02 MED ORDER — PHENYTOIN 125 MG/5ML PO SUSP
87.5000 mg | Freq: Two times a day (BID) | ORAL | Status: DC
  Administered 2016-10-02 – 2016-10-04 (×4): 87.5 mg via ORAL
  Filled 2016-10-02 (×7): qty 4

## 2016-10-02 MED ORDER — POTASSIUM CHLORIDE 20 MEQ PO PACK
20.0000 meq | PACK | Freq: Every day | ORAL | Status: DC
  Administered 2016-10-02: 20 meq via ORAL
  Filled 2016-10-02: qty 1

## 2016-10-02 MED ORDER — PHENYTOIN 125 MG/5ML PO SUSP
100.0000 mg | Freq: Two times a day (BID) | ORAL | Status: DC
  Filled 2016-10-02: qty 4

## 2016-10-02 MED ORDER — PHENYTOIN 125 MG/5ML PO SUSP
100.0000 mg | Freq: Once | ORAL | Status: AC
  Administered 2016-10-02: 100 mg via ORAL
  Filled 2016-10-02: qty 4

## 2016-10-02 MED ORDER — LORAZEPAM 2 MG/ML IJ SOLN
1.0000 mg | Freq: Once | INTRAMUSCULAR | Status: AC
  Administered 2016-10-02: 1 mg via INTRAVENOUS
  Filled 2016-10-02: qty 1

## 2016-10-02 NOTE — Progress Notes (Signed)
MEDICATION RELATED CONSULT NOTE - Follow up  Pharmacy Consult for Electrolyte monitoring and replacement Indication: hypokalemia  No Known Allergies  Patient Measurements: Height: 5\' 3"  (160 cm) Weight: 56 lb 8 oz (25.6 kg) (bed weight) IBW/kg (Calculated) : 52.4 Adjusted Body Weight:    Labs:  Recent Labs  09/29/16 1127 09/30/16 0529 10/01/16 0312 10/02/16 0409  CREATININE  --  0.46 0.52  --   MG 2.2  --  2.0  --   PHOS  --   --  2.8 3.6   Lab Results  Component Value Date   K 3.5 10/02/2016   Estimated Creatinine Clearance: 31.7 mL/min (by C-G formula based on SCr of 0.52 mg/dL).    Assessment: 56 yo F with hypokalemia.  Goal of Therapy:  electrolytes WNL  Plan:  K= 2.8  Mag= 2.0  Phos= 2.8 Patient received KCL IV 40 meq total this am. Will f/u K level at 1800. And labs in am.  8/31 at 1821: K 3.5 - will order KCl 20 mEq PO x1 Will recheck labs in AM  9/1: K 3.5, Phos 3.6 - Labs WNL will recheck K with AM labs tomottow.   Rocky Morel 10/02/2016,8:06 AM

## 2016-10-02 NOTE — Progress Notes (Signed)
[place] for Zosyn  Indication: sepsis  No Known Allergies  Patient Measurements: Height: 5\' 3"  (160 cm) Weight: 56 lb 8 oz (25.6 kg) (bed weight) IBW/kg (Calculated) : 52.4 Adjusted Body Weight:   Vital Signs: Temp: 97.8 F (36.6 C) (09/01 0514) Temp Source: Oral (09/01 0514) BP: 102/59 (09/01 0514) Pulse Rate: 90 (09/01 0514) Intake/Output from previous day: 08/31 0701 - 09/01 0700 In: 50 [IV Piggyback:50] Out: -  Intake/Output from this shift: No intake/output data recorded.  Labs:  Recent Labs  09/30/16 0529 10/01/16 0312  CREATININE 0.46 0.52   Estimated Creatinine Clearance: 31.7 mL/min (by C-G formula based on SCr of 0.52 mg/dL). No results for input(s): VANCOTROUGH, VANCOPEAK, VANCORANDOM, GENTTROUGH, GENTPEAK, GENTRANDOM, TOBRATROUGH, TOBRAPEAK, TOBRARND, AMIKACINPEAK, AMIKACINTROU, AMIKACIN in the last 72 hours.   Microbiology: Recent Results (from the past 720 hour(s))  Blood Culture (routine x 2)     Status: None (Preliminary result)   Collection Time: 09/28/16  9:10 PM  Result Value Ref Range Status   Specimen Description BLOOD RIGHT UPPER INNER ARM  Final   Special Requests   Final    BOTTLES DRAWN AEROBIC AND ANAEROBIC Blood Culture adequate volume   Culture NO GROWTH 4 DAYS  Final   Report Status PENDING  Incomplete  Blood Culture (routine x 2)     Status: Abnormal (Preliminary result)   Collection Time: 09/28/16  9:10 PM  Result Value Ref Range Status   Specimen Description BLOOD RIGHT UPPER ARM  Final   Special Requests   Final    BOTTLES DRAWN AEROBIC AND ANAEROBIC Blood Culture adequate volume   Culture  Setup Time   Final    GRAM POSITIVE COCCI ANAEROBIC BOTTLE ONLY CRITICAL RESULT CALLED TO, READ BACK BY AND VERIFIED WITH: KRISTIN MERRILL AT 2113 09/29/2016 BY TFK.    Culture (A)  Final    STAPHYLOCOCCUS SPECIES (COAGULASE NEGATIVE) THE SIGNIFICANCE OF ISOLATING THIS ORGANISM FROM A SINGLE SET OF  BLOOD CULTURES WHEN MULTIPLE SETS ARE DRAWN IS UNCERTAIN. PLEASE NOTIFY THE MICROBIOLOGY DEPARTMENT WITHIN ONE WEEK IF SPECIATION AND SENSITIVITIES ARE REQUIRED. Performed at [facility], [place] [address] [place], [place] [address]    Report Status PENDING  Incomplete  Urine culture     Status: None   Collection Time: 09/28/16  9:10 PM  Result Value Ref Range Status   Specimen Description URINE, RANDOM  Final   Special Requests NONE  Final   Culture   Final    NO GROWTH Performed at [facility], [address], [place], [place] [address]    Report Status 09/30/2016 FINAL  Final  Blood Culture ID Panel (Reflexed)     Status: Abnormal   Collection Time: 09/28/16  9:10 PM  Result Value Ref Range Status   Enterococcus species NOT DETECTED NOT DETECTED Final   Listeria monocytogenes NOT DETECTED NOT DETECTED Final   Staphylococcus species DETECTED (A) NOT DETECTED Final    Comment: Methicillin (oxacillin) susceptible coagulase negative staphylococcus. Possible blood culture contaminant (unless isolated from more than one blood culture draw or clinical case suggests pathogenicity). No antibiotic treatment is indicated for blood  culture contaminants. CRITICAL RESULT CALLED TO, READ BACK BY AND VERIFIED WITH: KRISTIN MERRILL AT 2113 09/29/2016 BY TFK.    Staphylococcus aureus NOT DETECTED NOT DETECTED Final   Methicillin resistance NOT DETECTED NOT DETECTED Final   Streptococcus species NOT DETECTED NOT DETECTED Final   Streptococcus agalactiae NOT DETECTED NOT  DETECTED Final   Streptococcus pneumoniae NOT DETECTED NOT DETECTED Final   Streptococcus pyogenes NOT DETECTED NOT DETECTED Final   Acinetobacter baumannii NOT DETECTED NOT DETECTED Final   Enterobacteriaceae species NOT DETECTED NOT DETECTED Final   Enterobacter cloacae complex NOT DETECTED NOT DETECTED Final   Escherichia coli NOT DETECTED NOT DETECTED Final   Klebsiella oxytoca NOT DETECTED NOT DETECTED Final    Klebsiella pneumoniae NOT DETECTED NOT DETECTED Final   Proteus species NOT DETECTED NOT DETECTED Final   Serratia marcescens NOT DETECTED NOT DETECTED Final   Haemophilus influenzae NOT DETECTED NOT DETECTED Final   Neisseria meningitidis NOT DETECTED NOT DETECTED Final   Pseudomonas aeruginosa NOT DETECTED NOT DETECTED Final   Candida albicans NOT DETECTED NOT DETECTED Final   Candida glabrata NOT DETECTED NOT DETECTED Final   Candida krusei NOT DETECTED NOT DETECTED Final   Candida parapsilosis NOT DETECTED NOT DETECTED Final   Candida tropicalis NOT DETECTED NOT DETECTED Final  CULTURE, BLOOD (ROUTINE X 2) w Reflex to ID Panel     Status: None (Preliminary result)   Collection Time: 09/30/16 11:12 PM  Result Value Ref Range Status   Specimen Description BLOOD RT ARM  Final   Special Requests   Final    BOTTLES DRAWN AEROBIC AND ANAEROBIC Blood Culture adequate volume   Culture NO GROWTH 2 DAYS  Final   Report Status PENDING  Incomplete  CULTURE, BLOOD (ROUTINE X 2) w Reflex to ID Panel     Status: None (Preliminary result)   Collection Time: 09/30/16 11:17 PM  Result Value Ref Range Status   Specimen Description BLOOD LT FOREARM  Final   Special Requests   Final    BOTTLES DRAWN AEROBIC AND ANAEROBIC Blood Culture adequate volume   Culture NO GROWTH 2 DAYS  Final   Report Status PENDING  Incomplete  Respiratory Panel by PCR     Status: None   Collection Time: 10/01/16  5:08 PM  Result Value Ref Range Status   Adenovirus NOT DETECTED NOT DETECTED Final   Coronavirus 229E NOT DETECTED NOT DETECTED Final   Coronavirus HKU1 NOT DETECTED NOT DETECTED Final   Coronavirus NL63 NOT DETECTED NOT DETECTED Final   Coronavirus OC43 NOT DETECTED NOT DETECTED Final   Metapneumovirus NOT DETECTED NOT DETECTED Final   Rhinovirus / Enterovirus NOT DETECTED NOT DETECTED Final   Influenza A NOT DETECTED NOT DETECTED Final   Influenza B NOT DETECTED NOT DETECTED Final   Parainfluenza Virus 1  NOT DETECTED NOT DETECTED Final   Parainfluenza Virus 2 NOT DETECTED NOT DETECTED Final   Parainfluenza Virus 3 NOT DETECTED NOT DETECTED Final   Parainfluenza Virus 4 NOT DETECTED NOT DETECTED Final   Respiratory Syncytial Virus NOT DETECTED NOT DETECTED Final   Bordetella pertussis NOT DETECTED NOT DETECTED Final   Chlamydophila pneumoniae NOT DETECTED NOT DETECTED Final   Mycoplasma pneumoniae NOT DETECTED NOT DETECTED Final    Comment: Performed at [facility], [place] [address], [place], [place] [address]   Assessment: CrCl = 31.7 ml/min   Goal of Therapy:  resolution of infection  Plan:  Expected duration 7 days with resolution of temperature and/or normalization of WBC   Continue Zosyn 3.375 gm IV Q8H EI.  Rocky Morel 10/02/2016,8:14 AM

## 2016-10-02 NOTE — Progress Notes (Addendum)
Pt very anxious and agitated at this time. Last PRN 1mg  of ativan was given at 1113. Prime doctor notified, new orders to place a one time dose of IV Ativan 1 mg. Will continue to monitor pt closely.  Theus Espin [employer]

## 2016-10-02 NOTE — Progress Notes (Signed)
[facility] [place] at [place] NAME: Roberta Ramos    MR#:  [id number]  DATE OF BIRTH:  [birth year]  SUBJECTIVE:  CHIEF COMPLAINT:   Chief Complaint  Patient presents with  . Fever   The patient is nonverbal. She has fever 100.4 last night. REVIEW OF SYSTEMS:  Review of Systems  Unable to perform ROS: Patient nonverbal    DRUG ALLERGIES:  No Known Allergies VITALS:  Blood pressure (!) 102/59, pulse 90, temperature 98.4 F (36.9 C), temperature source Oral, resp. rate 16, height 5\' 3"  (1.6 m), weight 56 lb 8 oz (25.6 kg), SpO2 99 %. PHYSICAL EXAMINATION:  Physical Exam  Constitutional: No distress.  Severe malnutrition  HENT:  Head: Normocephalic.  Eyes: Pupils are equal, round, and reactive to light. Conjunctivae and EOM are normal. No scleral icterus.  Cardiovascular: Normal rate, regular rhythm and normal heart sounds.  Exam reveals no gallop.   No murmur heard. Pulmonary/Chest: Effort normal and breath sounds normal. No stridor. No respiratory distress. She has no wheezes. She has no rales.  Abdominal: Soft. Bowel sounds are normal. She exhibits no distension. There is no tenderness. There is no rebound.  Musculoskeletal: Normal range of motion. She exhibits no edema or tenderness.  Neurological:  The patient is nonverbal,contracted upper and lower extremities. Multiple joint contractures of the hands and feet. Unable to exam.  Skin: No rash noted. No erythema.  Psychiatric: Affect normal.   LABORATORY PANEL:  Female CBC  Recent Labs Lab 09/28/16 2110  WBC 10.0  HGB 13.4  HCT 39.2  PLT 224   ------------------------------------------------------------------------------------------------------------------ Chemistries   Recent Labs Lab 09/28/16 2110  10/01/16 0312  10/02/16 0409  NA 149*  < > 144  --   --   K 3.3*  < > 2.8*  < > 3.5  CL 116*  < > 113*  --   --   CO2 23  < > 23  --   --   GLUCOSE 109*  < > 101*  --    --   BUN 15  < > <5*  --   --   CREATININE 0.45  < > 0.52  --   --   CALCIUM 9.0  < > 8.1*  --   --   MG  --   < > 2.0  --   --   AST 25  --   --   --   --   ALT 18  --   --   --   --   ALKPHOS 80  --   --   --   --   BILITOT 0.7  --   --   --   --   < > = values in this interval not displayed. RADIOLOGY:  No results found. ASSESSMENT AND PLAN:   This is a 56 year old female admitted for sepsis. 1. Sepsis:  Continue Zosyn and follow-up blood cultures: negative so far.  Per Dr. Burt Knack, No current evidence of acute cholecystitis. would not recommend any surgical intervention at this time. resp viral PCR is negative.  Per Dr. Ola Spurr, If neg and remains AF would change to oral augmentin for a total 10 days of abx.  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 The patient is nonverbal and eats a pured diet.  3. Epilepsy: No seizure activity with this illness. Continue Dilantin. Increase dose due to low level. 4. Hypokalemia: improved with potassium.  5. Severe malnutrition. Follow dietitian's recommendation.  All the records are reviewed and case discussed with Care Management/Social Worker. Management plans discussed with the patient's sister and she is in agreement.  CODE STATUS: Full Code  TOTAL TIME TAKING CARE OF THIS PATIENT: 26 minutes.   More than 50% of the time was spent in counseling/coordination of care: YES  POSSIBLE D/C IN 1-2 DAYS, DEPENDING ON CLINICAL CONDITION.   Demetrios Loll M.D on 10/02/2016 at 12:17 PM  Between 7am to 6pm - Pager - [phone]  After 6pm go to [email or web address] - [employer] [facility]

## 2016-10-02 NOTE — Progress Notes (Addendum)
RN was giving pt a bath this morning and noticed that pt is forming a stage I on her left shoulder blade area. Area was cleansed and foam dressing was applied to area and prime doctor was notified about new findings. No new orders given at this time. RN will rotate pt every two hours or less to make sure pressure injury does not worsens. Will monitor pt closely.   Soren Lazarz [employer]

## 2016-10-03 LAB — BASIC METABOLIC PANEL
ANION GAP: 14 (ref 5–15)
BUN: 12 mg/dL (ref 6–20)
CO2: 20 mmol/L — ABNORMAL LOW (ref 22–32)
Calcium: 8.1 mg/dL — ABNORMAL LOW (ref 8.9–10.3)
Chloride: 112 mmol/L — ABNORMAL HIGH (ref 101–111)
Creatinine, Ser: 0.78 mg/dL (ref 0.44–1.00)
GFR calc Af Amer: >60 mL/min (ref >60)
GLUCOSE: 84 mg/dL (ref 65–99)
POTASSIUM: 2.9 mmol/L — AB (ref 3.5–5.1)
SODIUM: 146 mmol/L — AB (ref 135–145)

## 2016-10-03 LAB — CBC
HEMATOCRIT: 35 % (ref 35.0–47.0)
HEMOGLOBIN: 12.4 g/dL (ref 12.0–16.0)
MCH: 31.6 pg (ref 26.0–34.0)
MCHC: 35.3 g/dL (ref 32.0–36.0)
MCV: 89.5 fL (ref 80.0–100.0)
Platelets: 377 10*3/uL (ref 150–440)
RBC: 3.91 MIL/uL (ref 3.80–5.20)
RDW: 14.4 % (ref 11.5–14.5)
WBC: 6.1 10*3/uL (ref 3.6–11.0)

## 2016-10-03 LAB — CULTURE, BLOOD (ROUTINE X 2)
Culture: NO GROWTH
Special Requests: ADEQUATE

## 2016-10-03 LAB — MAGNESIUM: Magnesium: 2.4 mg/dL (ref 1.7–2.4)

## 2016-10-03 MED ORDER — POTASSIUM CHLORIDE 20 MEQ PO PACK: 40.0000 meq | PACK | Freq: Two times a day (BID) | ORAL | Status: DC

## 2016-10-03 MED ORDER — POTASSIUM CHLORIDE 20 MEQ PO PACK
40.0000 meq | PACK | Freq: Two times a day (BID) | ORAL | Status: DC
  Administered 2016-10-03 – 2016-10-04 (×3): 40 meq via ORAL
  Filled 2016-10-03 (×3): qty 2

## 2016-10-03 NOTE — Progress Notes (Signed)
[facility] at [place] NAME: Roberta Ramos    MR#:  [id number]  DATE OF BIRTH:  04/03/1960  SUBJECTIVE:  CHIEF COMPLAINT:   Chief Complaint  Patient presents with  . Fever   still fever 100.5 last night, hypotension at 80/43 this am and tachycardia at 115. REVIEW OF SYSTEMS:  Review of Systems  Unable to perform ROS: Patient nonverbal    DRUG ALLERGIES:  No Known Allergies VITALS:  Blood pressure (!) 118/97, pulse (!) 115, temperature (!) 97.4 F (36.3 C), temperature source Oral, resp. rate 20, height 5\' 3"  (1.6 m), weight 56 lb 8 oz (25.6 kg), SpO2 100 %. PHYSICAL EXAMINATION:  Physical Exam  Constitutional: No distress.  Severe malnutrition  HENT:  Head: Normocephalic.  Eyes: Pupils are equal, round, and reactive to light. Conjunctivae and EOM are normal. No scleral icterus.  Cardiovascular: Normal rate, regular rhythm and normal heart sounds.  Exam reveals no gallop.   No murmur heard. Pulmonary/Chest: Effort normal and breath sounds normal. No stridor. No respiratory distress. She has no wheezes. She has no rales.  Abdominal: Soft. Bowel sounds are normal. She exhibits no distension. There is no tenderness. There is no rebound.  Musculoskeletal: Normal range of motion. She exhibits no edema or tenderness.  Neurological:  The patient is nonverbal,contracted upper and lower extremities. Multiple joint contractures of the hands and feet. Unable to exam.  Skin: No rash noted. No erythema.  Stage I ulcer on left shoulder blade  Psychiatric: Affect normal.   LABORATORY PANEL:  Female CBC  Recent Labs Lab 10/03/16 0430  WBC 6.1  HGB 12.4  HCT 35.0  PLT 377   ------------------------------------------------------------------------------------------------------------------ Chemistries   Recent Labs Lab 09/28/16 2110  10/03/16 0430  NA 149*  < > 146*  K 3.3*  < > 2.9*  CL 116*  < > 112*  CO2 23  < > 20*  GLUCOSE  109*  < > 84  BUN 15  < > 12  CREATININE 0.45  < > 0.78  CALCIUM 9.0  < > 8.1*  MG  --   < > 2.4  AST 25  --   --   ALT 18  --   --   ALKPHOS 80  --   --   BILITOT 0.7  --   --   < > = values in this interval not displayed. RADIOLOGY:  No results found. ASSESSMENT AND PLAN:   This is a 56 year old female admitted for sepsis. 1. Sepsis: still fever 100.5 last night, hypotension and tachycardia this am. Continue Zosyn and follow-up blood cultures: negative so far.  Per Dr. Burt Knack, No current evidence of acute cholecystitis. would not recommend any surgical intervention at this time. resp viral PCR is negative.  Per Dr. Ola Spurr, If neg and remains AF would change to oral augmentin for a total 10 days of abx.  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 The patient is nonverbal and eats a pured diet.  3. Epilepsy: No seizure activity with this illness. Continue Dilantin. Increase dose due to low level. 4. Hypokalemia: K down to 2.9, given potassium, follow-up 5. Severe malnutrition. Follow dietitian's recommendation.  Hypernatremia. encouraged oral fluid intake. Follow-up BMP.  Stage I ulcer on left shoulder blade  All the records are reviewed and case discussed with Care Management/Social Worker. Management plans discussed with the patient's sister and she is in agreement.  CODE STATUS: Full Code  TOTAL TIME TAKING CARE OF THIS PATIENT: 36 minutes.   More than 50% of the time was spent in counseling/coordination of care: YES  POSSIBLE D/C IN 1-2 DAYS, DEPENDING ON CLINICAL CONDITION.   Demetrios Loll M.D on 10/03/2016 at 1:56 PM  Between 7am to 6pm - Pager - [phone]  After 6pm go to [email or web address] - [employer] [facility]

## 2016-10-03 NOTE — Progress Notes (Signed)
MEDICATION RELATED CONSULT NOTE - Follow up  Pharmacy Consult for Electrolyte monitoring and replacement Indication: hypokalemia  No Known Allergies  Patient Measurements: Height: 5\' 3"  (160 cm) Weight: 56 lb 8 oz (25.6 kg) (bed weight) IBW/kg (Calculated) : 52.4 Adjusted Body Weight:    Labs:  Recent Labs  10/01/16 0312 10/02/16 0409 10/03/16 0430  WBC  --   --  6.1  HGB  --   --  12.4  HCT  --   --  35.0  PLT  --   --  377  CREATININE 0.52  --  0.78  MG 2.0  --  2.4  PHOS 2.8 3.6  --    Lab Results  Component Value Date   K 2.9 (L) 10/03/2016   Estimated Creatinine Clearance: 31.7 mL/min (by C-G formula based on SCr of 0.78 mg/dL).    Assessment: 56 yo F with hypokalemia. Wt 25.6 kg  Goal of Therapy:  electrolytes WNL  Plan:  K= 2.8  Mag= 2.0  Phos= 2.8 Patient received KCL IV 40 meq total this am. Will f/u K level at 1800. And labs in am.  8/31 at 1821: K 3.5 - will order KCl 20 mEq PO x1 Will recheck labs in AM  9/1: K 3.5, Phos 3.6 - Labs WNL will recheck K with AM labs tomottow.   9/2: K 2.9, Mag 2.4 - MD has ordered KCl 40 mEq BID x4 doses. Will recheck labs in AM.  Rayna Sexton L 10/03/2016,10:21 AM

## 2016-10-04 LAB — BASIC METABOLIC PANEL
Anion gap: 7 (ref 5–15)
BUN: 9 mg/dL (ref 6–20)
CALCIUM: 8.3 mg/dL — AB (ref 8.9–10.3)
CO2: 27 mmol/L (ref 22–32)
Chloride: 117 mmol/L — ABNORMAL HIGH (ref 101–111)
Creatinine, Ser: 0.64 mg/dL (ref 0.44–1.00)
GFR calc Af Amer: >60 mL/min (ref >60)
GLUCOSE: 113 mg/dL — AB (ref 65–99)
POTASSIUM: 2.5 mmol/L — AB (ref 3.5–5.1)
SODIUM: 151 mmol/L — AB (ref 135–145)

## 2016-10-04 LAB — MAGNESIUM: Magnesium: 2.4 mg/dL (ref 1.7–2.4)

## 2016-10-04 MED ORDER — AMOXICILLIN-POT CLAVULANATE 400-57 MG/5ML PO SUSR
400.0000 mg | Freq: Two times a day (BID) | ORAL | Status: DC
  Administered 2016-10-04: 400 mg via ORAL
  Filled 2016-10-04 (×2): qty 5

## 2016-10-04 MED ORDER — AMOXICILLIN-POT CLAVULANATE 500-125 MG PO TABS: 500.0000 mg | ORAL_TABLET | Freq: Two times a day (BID) | ORAL | 0 refills | Status: DC

## 2016-10-04 MED ORDER — AMOXICILLIN-POT CLAVULANATE 500-125 MG PO TABS
500.0000 mg | ORAL_TABLET | Freq: Two times a day (BID) | ORAL | Status: DC
  Filled 2016-10-04: qty 1

## 2016-10-04 MED ORDER — POTASSIUM CHLORIDE 20 MEQ PO PACK
40.0000 meq | PACK | Freq: Two times a day (BID) | ORAL | 0 refills | Status: DC
Start: 2016-10-04 — End: 2018-04-06

## 2016-10-04 MED ORDER — AMOXICILLIN-POT CLAVULANATE 400-57 MG/5ML PO SUSR: 400.0000 mg | Freq: Two times a day (BID) | ORAL | 0 refills | Status: DC

## 2016-10-04 MED ORDER — POTASSIUM CHLORIDE 10 MEQ/100ML IV SOLN
10.0000 meq | INTRAVENOUS | Status: DC
  Filled 2016-10-04: qty 100

## 2016-10-04 MED ORDER — POTASSIUM CHLORIDE 20 MEQ PO PACK: 40.0000 meq | PACK | Freq: Once | ORAL | Status: DC

## 2016-10-04 NOTE — Discharge Instructions (Signed)
Aspiration precaution. Follow up potassium level.

## 2016-10-04 NOTE — Progress Notes (Signed)
Notified MD of low potassium level, orders taken

## 2016-10-04 NOTE — Care Management (Addendum)
Unable to get sister back on the phone in the room or her cell phone. Explained to her that I am having difficulty finding an agency that can accept her at this time. I told her I would continue to work on her case and get back in touch with her as soon as I here something.  Spoke with  primary nurse Edson Snowball), patient has discharged and he stated that he did education with the family regarding potassium level and md discharged her anyway because family wanted to take her home and giver her oral potasium in the home. Dr.Chen updated on POC and states he is aware of potassium but family insisted that they take patient home

## 2016-10-04 NOTE — Care Management Note (Signed)
Case Management Note  Patient Details  Name: Roberta Ramos MRN: [id number] Date of Birth: 04/13/1960  Subjective/Objective:  Admitted with  Fever and sepsis. HX: cerebral palsy, cared for by her sister. Levada Dy. Spoke with sister and plan is home with home health.She does not currently have any home health. Will set up with an agency that can accept her insurance. No dme needs. Will go home by car per Levada Dy. Updated Levada Dy. On POC., she is in agreement                    Action/Plan:   Expected Discharge Date:  10/04/16               Expected Discharge Plan:  [place]  In-House Referral:     Discharge planning Services  CM Consult  Post Acute Care Choice:  Home Health Choice offered to:  Sibling  DME Arranged:    DME Agency:     HH Arranged:  RN [place] Agency:  [facility]  Status of Service:  Completed, signed off  If discussed at [place] of Stay Meetings, dates discussed:    Additional Comments:  Jolly Mango, RN 10/04/2016, 11:52 AM

## 2016-10-04 NOTE — Progress Notes (Signed)
Laurin Coder Topor to be D/C'd Home per MD order.  Discussed prescriptions and follow up appointments with caregiver. Prescriptions given to caregiver, medication list explained in detail. Sister verbalized understanding.    Vitals:   10/04/16 0503 10/04/16 0806  BP: (!) 119/55   Pulse: 81   Resp: 16   Temp: 98.9 F (37.2 C)   SpO2: 100% 96%    An After Visit Summary was printed and given to the patient. Patient escorted via [place], and D/C home via private auto.  Francesco Sor

## 2016-10-04 NOTE — Discharge Summary (Addendum)
Roberta Ramos at [place] NAME: Roberta Ramos    MR#:  [id number]  DATE OF BIRTH:  Mar 08, 1960  DATE OF ADMISSION:  09/28/2016   ADMITTING PHYSICIAN: Roberta Foreman, MD  DATE OF DISCHARGE:  10/04/2016 PRIMARY CARE PHYSICIAN: Roberta Merles, MD   ADMISSION DIAGNOSIS:  Pain [R52] Calculus of gallbladder without cholecystitis without obstruction [K80.20] Febrile illness [R50.9] DISCHARGE DIAGNOSIS:  Active Problems:   Sepsis ([place])   Protein-calorie malnutrition, severe   Pressure injury of skin  SECONDARY DIAGNOSIS:   Past Medical History:  Diagnosis Date  . Arthritis   . COPD (chronic obstructive pulmonary disease) ([place])   . Seizures [facility])    HOSPITAL COURSE:   This is a 56 year old female admitted for sepsis. 1. Sepsis: still fever 99.5 last night, hypotension and tachycardia improved. Disontinue Zosyn and change to augmentin.  Blood cultures: negative so far.  Per Dr. Burt Ramos, No current evidence of acute cholecystitis. would not recommend any surgical intervention at this time. resp viral PCR is negative.  Per Dr. Ola Ramos, If neg and remains AF would change to oral augmentin for a total 10 days of abx.  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 The patient is nonverbal and eats a pured diet.  3. Epilepsy: No seizure activity with this illness. Continue Dilantin. Increase dose due to low level. 4. Hypokalemia: K down to 2.5 today. Per the patient's sister, the patient could not take potassium due to taste and has poor oral intake in the hospital. The patient doesn't like hospital food. The sister said the patient ate well at home. She will take the patient home and give potassium with food at home. She wants the patient to be discharged today. She said the patient has home nurse and PCP to monitor lab. 5. Severe malnutrition. Follow dietitian's  recommendation.  Hypernatremia. encouraged oral fluid intake. Follow-up BMP as outpatient.  Stage I ulcer on left shoulder blade  DISCHARGE CONDITIONS:  Thought the potassium level is low, the patient's sister wants patient to be discharged home today. Discharge to home with [facility]. CONSULTS OBTAINED:  Treatment Team:  Roberta Ramsay, MD DRUG ALLERGIES:  No Known Allergies DISCHARGE MEDICATIONS:   Allergies as of 10/04/2016   No Known Allergies     Medication List    TAKE these medications   albuterol (2.5 MG/3ML) 0.083% nebulizer solution Commonly known as:  PROVENTIL Take 2.5 mg by nebulization 3 (three) times daily as needed for wheezing or shortness of breath.   amoxicillin-clavulanate 400-57 MG/5ML suspension Commonly known as:  AUGMENTIN Take 5 mLs (400 mg total) by mouth every 12 (twelve) hours.   budesonide 0.5 MG/2ML nebulizer solution Commonly known as:  PULMICORT Take 0.5 mg by nebulization 2 (two) times daily.   diazepam 5 MG tablet Commonly known as:  VALIUM Take 5 mg by mouth 3 (three) times daily as needed for anxiety or muscle spasms.   ibuprofen 100 MG/5ML suspension Commonly known as:  ADVIL,MOTRIN Take 200 mg by mouth every 4 (four) hours as needed for fever or mild pain.   ipratropium-albuterol 0.5-2.5 (3) MG/3ML Soln Commonly known as:  DUONEB Take 3 mLs by nebulization every 4 (four) hours as needed (wheezing).   loratadine 5 MG/5ML syrup Commonly known as:  CLARITIN Take 5 mg by mouth at bedtime.   phenytoin 125 MG/5ML suspension Commonly known as:  DILANTIN Take 87.5 mg by mouth 2 (two) times daily.  polyethylene glycol packet Commonly known as:  MIRALAX / GLYCOLAX Take 17 g by mouth daily.   potassium chloride 20 MEQ packet Commonly known as:  KLOR-CON Take 40 mEq by mouth 2 (two) times daily.   ranitidine 150 MG/10ML syrup Commonly known as:  ZANTAC Take 112.5 mg by mouth 2 (two) times daily.   simethicone 80 MG chewable  tablet Commonly known as:  MYLICON Chew 80 mg by mouth as needed for flatulence.            Discharge Care Instructions        Start     Ordered   10/04/16 0000  Increase activity slowly     10/04/16 0916   10/04/16 0000  Diet - low sodium heart healthy     10/04/16 0916   10/04/16 0000  potassium chloride (KLOR-CON) 20 MEQ packet  2 times daily     10/04/16 0921   10/04/16 0000  amoxicillin-clavulanate (AUGMENTIN) 400-57 MG/5ML suspension  Every 12 hours     10/04/16 0933       DISCHARGE INSTRUCTIONS:  See AVS.  If you experience worsening of your admission symptoms, develop shortness of breath, life threatening emergency, suicidal or homicidal thoughts you must seek medical attention immediately by calling 911 or calling your MD immediately  if symptoms less severe.  You Must read complete instructions/literature along with all the possible adverse reactions/side effects for all the Medicines you take and that have been prescribed to you. Take any new Medicines after you have completely understood and accpet all the possible adverse reactions/side effects.   Please note  You were cared for by a hospitalist during your hospital stay. If you have any questions about your discharge medications or the care you received while you were in the hospital after you are discharged, you can call the unit and asked to speak with the hospitalist on call if the hospitalist that took care of you is not available. Once you are discharged, your primary care physician will handle any further medical issues. Please note that NO REFILLS for any discharge medications will be authorized once you are discharged, as it is imperative that you return to your primary care physician (or establish a relationship with a primary care physician if you do not have one) for your aftercare needs so that they can reassess your need for medications and monitor your lab values.    On the day of Discharge:  VITAL  SIGNS:  Blood pressure (!) 119/55, pulse 81, temperature 98.9 F (37.2 C), resp. rate 16, height 5\' 3"  (1.6 m), weight 53 lb 1.6 oz (24.1 kg), SpO2 96 %. PHYSICAL EXAMINATION:  GENERAL:  56 y.o.-year-old patient lying in the bed with no acute distress.  Physical Exam  Constitutional: No distress.  Severe malnutrition  HENT:  Head: Normocephalic.  Eyes: Pupils are equal, round, and reactive to light. Conjunctivae and EOM are normal. No scleral icterus.  Cardiovascular: Normal rate, regular rhythm and normal heart sounds.  Exam reveals no gallop.   No murmur heard. Pulmonary/Chest: Effort normal and breath sounds normal. No stridor. No respiratory distress. She has no wheezes. She has no rales.  Abdominal: Soft. Bowel sounds are normal. She exhibits no distension. There is no tenderness. There is no rebound.  Musculoskeletal: Normal range of motion. She exhibits no edema or tenderness.  Neurological:  The patient is nonverbal,contracted upper and lower extremities. Multiple joint contractures of the hands and feet. Unable to exam.  Skin: No rash  noted. No erythema.  Stage I ulcer on left shoulder blade  DATA REVIEW:   CBC  Recent Labs Lab 10/03/16 0430  WBC 6.1  HGB 12.4  HCT 35.0  PLT 377    Chemistries   Recent Labs Lab 09/28/16 2110  10/04/16 0347  NA 149*  < > 151*  K 3.3*  < > 2.5*  CL 116*  < > 117*  CO2 23  < > 27  GLUCOSE 109*  < > 113*  BUN 15  < > 9  CREATININE 0.45  < > 0.64  CALCIUM 9.0  < > 8.3*  MG  --   < > 2.4  AST 25  --   --   ALT 18  --   --   ALKPHOS 80  --   --   BILITOT 0.7  --   --   < > = values in this interval not displayed.   Microbiology Results  Results for orders placed or performed during the hospital encounter of 09/28/16  Blood Culture (routine x 2)     Status: None   Collection Time: 09/28/16  9:10 PM  Result Value Ref Range Status   Specimen Description BLOOD RIGHT UPPER INNER ARM  Final   Special Requests   Final     BOTTLES DRAWN AEROBIC AND ANAEROBIC Blood Culture adequate volume   Culture NO GROWTH 5 DAYS  Final   Report Status 10/03/2016 FINAL  Final  Blood Culture (routine x 2)     Status: Abnormal   Collection Time: 09/28/16  9:10 PM  Result Value Ref Range Status   Specimen Description BLOOD RIGHT UPPER ARM  Final   Special Requests   Final    BOTTLES DRAWN AEROBIC AND ANAEROBIC Blood Culture adequate volume   Culture  Setup Time   Final    GRAM POSITIVE COCCI ANAEROBIC BOTTLE ONLY CRITICAL RESULT CALLED TO, READ BACK BY AND VERIFIED WITH: KRISTIN MERRILL AT 2113 09/29/2016 BY TFK.    Culture (A)  Final    STAPHYLOCOCCUS SPECIES (COAGULASE NEGATIVE) THE SIGNIFICANCE OF ISOLATING THIS ORGANISM FROM A SINGLE SET OF BLOOD CULTURES WHEN MULTIPLE SETS ARE DRAWN IS UNCERTAIN. PLEASE NOTIFY THE MICROBIOLOGY DEPARTMENT WITHIN ONE WEEK IF SPECIATION AND SENSITIVITIES ARE REQUIRED. Performed at [facility], [place] [address], [place], [place] [address]    Report Status 10/02/2016 FINAL  Final  Urine culture     Status: None   Collection Time: 09/28/16  9:10 PM  Result Value Ref Range Status   Specimen Description URINE, RANDOM  Final   Special Requests NONE  Final   Culture   Final    NO GROWTH Performed at [facility], [place] [address], [place], [place] [address]    Report Status 09/30/2016 FINAL  Final  Blood Culture ID Panel (Reflexed)     Status: Abnormal   Collection Time: 09/28/16  9:10 PM  Result Value Ref Range Status   Enterococcus species NOT DETECTED NOT DETECTED Final   Listeria monocytogenes NOT DETECTED NOT DETECTED Final   Staphylococcus species DETECTED (A) NOT DETECTED Final    Comment: Methicillin (oxacillin) susceptible coagulase negative staphylococcus. Possible blood culture contaminant (unless isolated from more than one blood culture draw or clinical case suggests pathogenicity). No antibiotic treatment is indicated for blood  culture contaminants. CRITICAL  RESULT CALLED TO, READ BACK BY AND VERIFIED WITH: KRISTIN MERRILL AT 2113 09/29/2016 BY TFK.    Staphylococcus aureus NOT DETECTED NOT DETECTED  Final   Methicillin resistance NOT DETECTED NOT DETECTED Final   Streptococcus species NOT DETECTED NOT DETECTED Final   Streptococcus agalactiae NOT DETECTED NOT DETECTED Final   Streptococcus pneumoniae NOT DETECTED NOT DETECTED Final   Streptococcus pyogenes NOT DETECTED NOT DETECTED Final   Acinetobacter baumannii NOT DETECTED NOT DETECTED Final   Enterobacteriaceae species NOT DETECTED NOT DETECTED Final   Enterobacter cloacae complex NOT DETECTED NOT DETECTED Final   Escherichia coli NOT DETECTED NOT DETECTED Final   Klebsiella oxytoca NOT DETECTED NOT DETECTED Final   Klebsiella pneumoniae NOT DETECTED NOT DETECTED Final   Proteus species NOT DETECTED NOT DETECTED Final   Serratia marcescens NOT DETECTED NOT DETECTED Final   Haemophilus influenzae NOT DETECTED NOT DETECTED Final   Neisseria meningitidis NOT DETECTED NOT DETECTED Final   Pseudomonas aeruginosa NOT DETECTED NOT DETECTED Final   Candida albicans NOT DETECTED NOT DETECTED Final   Candida glabrata NOT DETECTED NOT DETECTED Final   Candida krusei NOT DETECTED NOT DETECTED Final   Candida parapsilosis NOT DETECTED NOT DETECTED Final   Candida tropicalis NOT DETECTED NOT DETECTED Final  CULTURE, BLOOD (ROUTINE X 2) w Reflex to ID Panel     Status: None (Preliminary result)   Collection Time: 09/30/16 11:12 PM  Result Value Ref Range Status   Specimen Description BLOOD RT ARM  Final   Special Requests   Final    BOTTLES DRAWN AEROBIC AND ANAEROBIC Blood Culture adequate volume   Culture NO GROWTH 4 DAYS  Final   Report Status PENDING  Incomplete  CULTURE, BLOOD (ROUTINE X 2) w Reflex to ID Panel     Status: None (Preliminary result)   Collection Time: 09/30/16 11:17 PM  Result Value Ref Range Status   Specimen Description BLOOD LT FOREARM  Final   Special Requests   Final     BOTTLES DRAWN AEROBIC AND ANAEROBIC Blood Culture adequate volume   Culture NO GROWTH 4 DAYS  Final   Report Status PENDING  Incomplete  Respiratory Panel by PCR     Status: None   Collection Time: 10/01/16  5:08 PM  Result Value Ref Range Status   Adenovirus NOT DETECTED NOT DETECTED Final   Coronavirus 229E NOT DETECTED NOT DETECTED Final   Coronavirus HKU1 NOT DETECTED NOT DETECTED Final   Coronavirus NL63 NOT DETECTED NOT DETECTED Final   Coronavirus OC43 NOT DETECTED NOT DETECTED Final   Metapneumovirus NOT DETECTED NOT DETECTED Final   Rhinovirus / Enterovirus NOT DETECTED NOT DETECTED Final   Influenza A NOT DETECTED NOT DETECTED Final   Influenza B NOT DETECTED NOT DETECTED Final   Parainfluenza Virus 1 NOT DETECTED NOT DETECTED Final   Parainfluenza Virus 2 NOT DETECTED NOT DETECTED Final   Parainfluenza Virus 3 NOT DETECTED NOT DETECTED Final   Parainfluenza Virus 4 NOT DETECTED NOT DETECTED Final   Respiratory Syncytial Virus NOT DETECTED NOT DETECTED Final   Bordetella pertussis NOT DETECTED NOT DETECTED Final   Chlamydophila pneumoniae NOT DETECTED NOT DETECTED Final   Mycoplasma pneumoniae NOT DETECTED NOT DETECTED Final    Comment: Performed at [facility], [place] [address], [place], [place] [address]    RADIOLOGY:  No results found.   Management plans discussed with the patient, family and they are in agreement.  CODE STATUS: Full Code   TOTAL TIME TAKING CARE OF THIS PATIENT: 36 minutes.    Demetrios Loll M.D on 10/04/2016 at 12:34 PM  Between 7am to 6pm - Pager -  [phone]  After 6pm go to [email or web address] - Proofreader  [facility]  Office  [phone]  CC: Primary care physician; Roberta Merles, MD   Note: This dictation was prepared with Dragon dictation along with smaller phrase technology. Any transcriptional errors that result from this process are unintentional.

## 2016-10-04 NOTE — Progress Notes (Addendum)
Per Dr. Bridgett Larsson patient is going to be discharged home and no need to gain IV access at this time. Order for IV team consult will be discontinued. Potassium of 2.5, but sister is requesting for patient to be discharged as she said that she will eat better and drink her oral potassium at home. IV potassium and zosyn discontinued by MD. Will prepare patient for discharge.

## 2016-10-04 NOTE — Progress Notes (Addendum)
MEDICATION RELATED CONSULT NOTE - Follow up  Pharmacy Consult for Electrolyte monitoring and replacement Indication: hypokalemia  No Known Allergies  Patient Measurements: Height: 5\' 3"  (160 cm) Weight: 53 lb 1.6 oz (24.1 kg) IBW/kg (Calculated) : 52.4 Adjusted Body Weight:    Labs:  Recent Labs  10/02/16 0409 10/03/16 0430 10/04/16 0347  WBC  --  6.1  --   HGB  --  12.4  --   HCT  --  35.0  --   PLT  --  377  --   CREATININE  --  0.78 0.64  MG  --  2.4 2.4  PHOS 3.6  --   --    Lab Results  Component Value Date   K 2.5 (LL) 10/04/2016   Estimated Creatinine Clearance: 29.9 mL/min (by C-G formula based on SCr of 0.64 mg/dL).    Assessment: 56 yo F with hypokalemia. Wt 25.6 kg  Goal of Therapy:  electrolytes WNL  Plan:  K= 2.8  Mag= 2.0  Phos= 2.8 Patient received KCL IV 40 meq total this am. Will f/u K level at 1800. And labs in am.  8/31 at 1821: K 3.5 - will order KCl 20 mEq PO x1 Will recheck labs in AM  9/1: K 3.5, Phos 3.6 - Labs WNL will recheck K with AM labs tomottow.   9/2: K 2.9, Mag 2.4 - MD has ordered KCl 40 mEq BID x4 doses. Will recheck labs in AM.  9/3: K 2.5, Mag 2.4 - Pt received KCl 40 mEq x2 doses yesterday. MD ordered KCl 10 mEq IV x4 runs this AM but pt lost IV access overnight and RNs unable to place IV. IV team called. Asked RN to call pharmacy when IV reestablished. Will need to order IV KCl. For now pt also ordered KCl 40 PO BID x2 doses for today.  Rocky Morel 10/04/2016,8:53 AM  Addendum: RN note noted about no IV being placed. Will order additional one time KCl for 1400 if pt still here.   Rayna Sexton, PharmD, BCPS Clinical Pharmacist 10/04/2016 11:45 AM

## 2016-10-05 LAB — CULTURE, BLOOD (ROUTINE X 2)
CULTURE: NO GROWTH
Culture: NO GROWTH
SPECIAL REQUESTS: ADEQUATE
Special Requests: ADEQUATE

## 2016-10-05 NOTE — Care Management (Signed)
Patient accepted by [facility] for nursing. TC to [place], patient sister and caregiver with an  Update.

## 2016-10-06 LAB — PHENYTOIN LEVEL, FREE AND TOTAL
PHENYTOIN, TOTAL: 4.5 ug/mL — AB (ref 10.0–20.0)
Phenytoin, Free: 0.7 ug/mL — ABNORMAL LOW (ref 1.0–2.0)

## 2018-04-06 ENCOUNTER — Encounter: Payer: Self-pay | Admitting: Emergency Medicine

## 2018-04-06 ENCOUNTER — Inpatient Hospital Stay
Admission: EM | Admit: 2018-04-06 | Discharge: 2018-04-07 | DRG: 177 | Disposition: A | Discharge status: Home Health | Payer: Medicare Other | Attending: Internal Medicine | Admitting: Internal Medicine

## 2018-04-06 ENCOUNTER — Other Ambulatory Visit: Payer: Self-pay

## 2018-04-06 ENCOUNTER — Emergency Department: Payer: Medicare Other

## 2018-04-06 DIAGNOSIS — J690 Pneumonitis due to inhalation of food and vomit: Principal | ICD-10-CM | POA: Diagnosis present

## 2018-04-06 DIAGNOSIS — K219 Gastro-esophageal reflux disease without esophagitis: Secondary | ICD-10-CM | POA: Diagnosis present

## 2018-04-06 DIAGNOSIS — Z833 Family history of diabetes mellitus: Secondary | ICD-10-CM | POA: Diagnosis not present

## 2018-04-06 DIAGNOSIS — L89123 Pressure ulcer of left upper back, stage 3: Secondary | ICD-10-CM | POA: Diagnosis not present

## 2018-04-06 DIAGNOSIS — E876 Hypokalemia: Secondary | ICD-10-CM | POA: Diagnosis not present

## 2018-04-06 DIAGNOSIS — G40909 Epilepsy, unspecified, not intractable, without status epilepticus: Secondary | ICD-10-CM | POA: Diagnosis present

## 2018-04-06 DIAGNOSIS — R532 Functional quadriplegia: Secondary | ICD-10-CM | POA: Diagnosis not present

## 2018-04-06 DIAGNOSIS — Z7401 Bed confinement status: Secondary | ICD-10-CM

## 2018-04-06 DIAGNOSIS — Z8249 Family history of ischemic heart disease and other diseases of the circulatory system: Secondary | ICD-10-CM | POA: Diagnosis not present

## 2018-04-06 DIAGNOSIS — G809 Cerebral palsy, unspecified: Secondary | ICD-10-CM | POA: Diagnosis not present

## 2018-04-06 DIAGNOSIS — J441 Chronic obstructive pulmonary disease with (acute) exacerbation: Secondary | ICD-10-CM | POA: Diagnosis not present

## 2018-04-06 DIAGNOSIS — Z7951 Long term (current) use of inhaled steroids: Secondary | ICD-10-CM | POA: Diagnosis not present

## 2018-04-06 DIAGNOSIS — J9601 Acute respiratory failure with hypoxia: Secondary | ICD-10-CM | POA: Diagnosis present

## 2018-04-06 HISTORY — DX: Cerebral palsy, unspecified: G80.9

## 2018-04-06 LAB — COMPREHENSIVE METABOLIC PANEL
ALT: 9 U/L (ref 0–44)
AST: 15 U/L (ref 15–41)
Albumin: 3.4 g/dL — ABNORMAL LOW (ref 3.5–5.0)
Alkaline Phosphatase: 85 U/L (ref 38–126)
Anion gap: 6 (ref 5–15)
BILIRUBIN TOTAL: 0.4 mg/dL (ref 0.3–1.2)
BUN: 7 mg/dL (ref 6–20)
CHLORIDE: 116 mmol/L — AB (ref 98–111)
CO2: 22 mmol/L (ref 22–32)
CREATININE: 0.31 mg/dL — AB (ref 0.44–1.00)
Calcium: 8 mg/dL — ABNORMAL LOW (ref 8.9–10.3)
GFR calc Af Amer: >60 mL/min (ref >60)
Glucose, Bld: 94 mg/dL (ref 70–99)
Potassium: 3.2 mmol/L — ABNORMAL LOW (ref 3.5–5.1)
Sodium: 144 mmol/L (ref 135–145)
Total Protein: 6.8 g/dL (ref 6.5–8.1)

## 2018-04-06 LAB — CBC WITH DIFFERENTIAL/PLATELET
ABS IMMATURE GRANULOCYTES: 0.02 10*3/uL (ref 0.00–0.07)
Basophils Absolute: 0 10*3/uL (ref 0.0–0.1)
Basophils Relative: 0 %
Eosinophils Absolute: 0.3 10*3/uL (ref 0.0–0.5)
Eosinophils Relative: 4 %
HEMATOCRIT: 45.8 % (ref 36.0–46.0)
HEMOGLOBIN: 14.7 g/dL (ref 12.0–15.0)
IMMATURE GRANULOCYTES: 0 %
LYMPHS ABS: 1.2 10*3/uL (ref 0.7–4.0)
Lymphocytes Relative: 17 %
MCH: 30.9 pg (ref 26.0–34.0)
MCHC: 32.1 g/dL (ref 30.0–36.0)
MCV: 96.2 fL (ref 80.0–100.0)
MONOS PCT: 6 %
Monocytes Absolute: 0.5 10*3/uL (ref 0.1–1.0)
NEUTROS ABS: 5.1 10*3/uL (ref 1.7–7.7)
NEUTROS PCT: 73 %
Platelets: 280 10*3/uL (ref 150–400)
RBC: 4.76 MIL/uL (ref 3.87–5.11)
RDW: 15.3 % (ref 11.5–15.5)
WBC: 7.2 10*3/uL (ref 4.0–10.5)
nRBC: 0 % (ref 0.0–0.2)

## 2018-04-06 LAB — TROPONIN I: Troponin I: <0.03 ng/mL (ref <0.03)

## 2018-04-06 MED ORDER — SODIUM CHLORIDE 0.9 % IV SOLN
3.0000 g | Freq: Once | INTRAVENOUS | Status: AC
  Administered 2018-04-06: 3 g via INTRAVENOUS
  Filled 2018-04-06: qty 3

## 2018-04-06 MED ORDER — SIMETHICONE 80 MG PO CHEW
80.0000 mg | CHEWABLE_TABLET | ORAL | Status: DC | PRN
Start: 2018-04-06 — End: 2018-04-07
  Filled 2018-04-06: qty 1

## 2018-04-06 MED ORDER — ENOXAPARIN SODIUM 40 MG/0.4ML ~~LOC~~ SOLN: 40.0000 mg | SUBCUTANEOUS | Status: DC

## 2018-04-06 MED ORDER — IPRATROPIUM-ALBUTEROL 0.5-2.5 (3) MG/3ML IN SOLN: 3.0000 mL | RESPIRATORY_TRACT | Status: DC | PRN

## 2018-04-06 MED ORDER — ACETAMINOPHEN 650 MG RE SUPP: 650.0000 mg | Freq: Four times a day (QID) | RECTAL | Status: DC | PRN

## 2018-04-06 MED ORDER — PHENYTOIN 125 MG/5ML PO SUSP
87.5000 mg | Freq: Two times a day (BID) | ORAL | Status: DC
  Administered 2018-04-07: 87.5 mg via ORAL
  Filled 2018-04-06 (×3): qty 4

## 2018-04-06 MED ORDER — SODIUM CHLORIDE 0.9 % IV SOLN
3.0000 g | Freq: Four times a day (QID) | INTRAVENOUS | Status: DC
  Administered 2018-04-06 – 2018-04-07 (×2): 3 g via INTRAVENOUS
  Filled 2018-04-06 (×6): qty 3

## 2018-04-06 MED ORDER — HEPARIN SODIUM (PORCINE) 5000 UNIT/ML IJ SOLN
5000.0000 [IU] | Freq: Three times a day (TID) | INTRAMUSCULAR | Status: DC
  Administered 2018-04-06 – 2018-04-07 (×3): 5000 [IU] via SUBCUTANEOUS
  Filled 2018-04-06 (×3): qty 1

## 2018-04-06 MED ORDER — ORAL CARE MOUTH RINSE
15.0000 mL | Freq: Two times a day (BID) | OROMUCOSAL | Status: DC
  Administered 2018-04-06 – 2018-04-07 (×2): 15 mL via OROMUCOSAL

## 2018-04-06 MED ORDER — BUDESONIDE 0.5 MG/2ML IN SUSP
0.5000 mg | Freq: Two times a day (BID) | RESPIRATORY_TRACT | Status: DC
  Administered 2018-04-06 – 2018-04-07 (×2): 0.5 mg via RESPIRATORY_TRACT
  Filled 2018-04-06 (×2): qty 2

## 2018-04-06 MED ORDER — POLYETHYLENE GLYCOL 3350 17 G PO PACK: 17.0000 g | PACK | Freq: Every day | ORAL | Status: DC | PRN

## 2018-04-06 MED ORDER — DIAZEPAM 5 MG PO TABS: 5.0000 mg | ORAL_TABLET | Freq: Three times a day (TID) | ORAL | Status: DC | PRN

## 2018-04-06 MED ORDER — DEXTROSE-NACL 5-0.45 % IV SOLN
INTRAVENOUS | Status: DC
  Administered 2018-04-06: 18:00:00 via INTRAVENOUS

## 2018-04-06 MED ORDER — ONDANSETRON HCL 4 MG/2ML IJ SOLN: 4.0000 mg | Freq: Four times a day (QID) | INTRAMUSCULAR | Status: DC | PRN

## 2018-04-06 MED ORDER — PANTOPRAZOLE SODIUM 40 MG IV SOLR
40.0000 mg | INTRAVENOUS | Status: DC
  Administered 2018-04-06: 18:00:00 40 mg via INTRAVENOUS
  Filled 2018-04-06: qty 40

## 2018-04-06 MED ORDER — LORATADINE 5 MG/5ML PO SYRP
5.0000 mg | ORAL_SOLUTION | Freq: Every day | ORAL | Status: DC
  Filled 2018-04-06 (×2): qty 5

## 2018-04-06 MED ORDER — ONDANSETRON HCL 4 MG PO TABS: 4.0000 mg | ORAL_TABLET | Freq: Four times a day (QID) | ORAL | Status: DC | PRN

## 2018-04-06 MED ORDER — POTASSIUM CHLORIDE 20 MEQ PO PACK: 40.0000 meq | PACK | Freq: Once | ORAL | Status: DC

## 2018-04-06 MED ORDER — RANITIDINE HCL 150 MG/10ML PO SYRP
112.5000 mg | ORAL_SOLUTION | Freq: Two times a day (BID) | ORAL | Status: DC
  Administered 2018-04-07: 10:00:00 112.5 mg via ORAL
  Filled 2018-04-06 (×3): qty 10

## 2018-04-06 MED ORDER — ACETAMINOPHEN 325 MG PO TABS: 650.0000 mg | ORAL_TABLET | Freq: Four times a day (QID) | ORAL | Status: DC | PRN

## 2018-04-06 NOTE — Progress Notes (Signed)
Family Meeting Note  Advance Directive:yes  Today a meeting took place with the Patient, sister.  Patient is unable to participate due DT:[id number] capacity Severe cerebral palsy   The following clinical team members were present during this meeting:MD  The following were discussed:Patient's diagnosis: Aspiration pneumonia, Patient's progosis: Unable to determine and Goals for treatment: Full Code  Additional follow-up to be provided: prn  Time spent during discussion:20 minutes  Gorden Harms, MD

## 2018-04-06 NOTE — ED Triage Notes (Signed)
Pt brought in via [facility] from home where her sister takes care of her. Pt has had issues with clearing her secretions recently. Pt has had multiple "colds," recently. Pt in NAD at this time and sister who cares for stating she is at her baseline. Pt has choked on secretions due to the inability to cough strong enough to clear them.

## 2018-04-06 NOTE — H&P (Signed)
[facility] NAME: Roberta Ramos    MR#:  [id number]  DATE OF BIRTH:  Feb 29, 1960  DATE OF ADMISSION:  04/06/2018  PRIMARY CARE PHYSICIAN: Marguerita Merles, MD   REQUESTING/REFERRING PHYSICIAN:   CHIEF COMPLAINT:   Chief Complaint  Patient presents with  . Cough    lack of ability to cough    HISTORY OF PRESENT ILLNESS: Roberta Ramos  is a 58 y.o. 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, chronic left shoulder pressure sore, presented to the emergency room with choking spells, brought to emergency room via EMS, noted to be hypoxic, subsequently placed on oxygen, in the emergency room patient was found to have potassium of 3.2, chest x-ray noted for right-sided pneumonia, patient valuated at the bedside in the emergency room, the patient's sister is present, patient is poor historian due to severe cerebral palsy, patient now be admitted for acute community-acquired aspiration pneumonia.  PAST MEDICAL HISTORY:   Past Medical History:  Diagnosis Date  . Arthritis   . Cerebral palsy ([place])   . COPD (chronic obstructive pulmonary disease) ([place])   . Seizures ([place])     PAST SURGICAL HISTORY:  Past Surgical History:  Procedure Laterality Date  . none      SOCIAL HISTORY:  Social History   Tobacco Use  . Smoking status: Never Smoker  . Smokeless tobacco: Never Used  Substance Use Topics  . Alcohol use: No    FAMILY HISTORY:  Family History  Problem Relation Age of Onset  . Hypertension Mother   . Diabetes Mellitus II Brother     DRUG ALLERGIES: No Known Allergies  REVIEW OF SYSTEMS: Poor historian due to severe cerebral palsy  CONSTITUTIONAL: No fever, fatigue or weakness.  EYES: No blurred or double vision.  EARS, NOSE, AND THROAT: No tinnitus or ear pain.  RESPIRATORY: No cough, shortness of breath, wheezing or hemoptysis.  CARDIOVASCULAR: No  chest pain, orthopnea, edema.  GASTROINTESTINAL: No nausea, vomiting, diarrhea or abdominal pain.  GENITOURINARY: No dysuria, hematuria.  ENDOCRINE: No polyuria, nocturia,  HEMATOLOGY: No anemia, easy bruising or bleeding SKIN: No rash or lesion. MUSCULOSKELETAL: No joint pain or arthritis.   NEUROLOGIC: No tingling, numbness, weakness.  PSYCHIATRY: No anxiety or depression.   MEDICATIONS AT HOME:  Prior to Admission medications   Medication Sig Start Date End Date Taking? Authorizing Provider  albuterol (PROVENTIL) (2.5 MG/3ML) 0.083% nebulizer solution Take 2.5 mg by nebulization every 4 (four) hours as needed for wheezing or shortness of breath.    Yes [provider]  budesonide (PULMICORT) 0.5 MG/2ML nebulizer solution Take 0.5 mg by nebulization 2 (two) times daily.   Yes [provider]  diazepam (VALIUM) 5 MG tablet Take 5 mg by mouth 3 (three) times daily as needed for anxiety or muscle spasms.   Yes [provider]  ipratropium (ATROVENT) 0.02 % nebulizer solution Take 0.5 mg by nebulization every 4 (four) hours as needed for wheezing or shortness of breath.    Yes [provider]  loratadine (CLARITIN) 5 MG/5ML syrup Take 5 mg by mouth at bedtime.   Yes [provider]  phenytoin (DILANTIN) 125 MG/5ML suspension Take 87.5 mg by mouth 2 (two) times daily.   Yes [provider]  polyethylene glycol (MIRALAX / GLYCOLAX) packet Take 17 g by mouth daily as needed for mild constipation or moderate constipation.    Yes [provider]  ranitidine (ZANTAC) 150 MG/10ML syrup Take 112.5 mg by mouth 2 (two) times daily.   Yes [provider]  simethicone (MYLICON) 80 MG chewable tablet Chew 80 mg by mouth as needed for flatulence.   Yes [provider]      PHYSICAL EXAMINATION:   VITAL SIGNS: Blood pressure 128/79, pulse (!) 110, temperature 97.7 F (36.5 C), temperature source Axillary, resp. rate (!) 24,  SpO2 100 %.  GENERAL:  58 y.o.-year-old patient lying in the bed with no acute distress.  Frail-appearing EYES: Pupils equal, round, reactive to light and accommodation. No scleral icterus. Extraocular muscles intact.  HEENT: Head atraumatic, normocephalic. Oropharynx and nasopharynx clear.  NECK:  Supple, no jugular venous distention. No thyroid enlargement, no tenderness.  LUNGS: Rhonchi bilaterally. No use of accessory muscles of respiration.  CARDIOVASCULAR: S1, S2 normal. No murmurs, rubs, or gallops.  ABDOMEN: Soft, nontender, nondistended. Bowel sounds present. No organomegaly or mass.  EXTREMITIES: No pedal edema, cyanosis, or clubbing.  NEUROLOGIC: Cranial nerves II through XII are intact.  Functional quadriplegic, nonverbal   PSYCHIATRIC: The patient is awake, nonverbal  SKIN: No obvious rash, lesion, or ulcer.  Chronic left shoulder wound  LABORATORY PANEL:   CBC Recent Labs  Lab 04/06/18 1227  WBC 7.2  HGB 14.7  HCT 45.8  PLT 280  MCV 96.2  MCH 30.9  MCHC 32.1  RDW 15.3  LYMPHSABS 1.2  MONOABS 0.5  EOSABS 0.3  BASOSABS 0.0   ------------------------------------------------------------------------------------------------------------------  Chemistries  Recent Labs  Lab 04/06/18 1310  NA 144  K 3.2*  CL 116*  CO2 22  GLUCOSE 94  BUN 7  CREATININE 0.31*  CALCIUM 8.0*  AST 15  ALT 9  ALKPHOS 85  BILITOT 0.4   ------------------------------------------------------------------------------------------------------------------ CrCl cannot be calculated (Unknown ideal weight.). ------------------------------------------------------------------------------------------------------------------ No results for input(s): TSH, T4TOTAL, T3FREE, THYROIDAB in the last 72 hours.  Invalid input(s): FREET3   Coagulation profile No results for input(s): INR, PROTIME in the last 168  hours. ------------------------------------------------------------------------------------------------------------------- No results for input(s): DDIMER in the last 72 hours. -------------------------------------------------------------------------------------------------------------------  Cardiac Enzymes Recent Labs  Lab 04/06/18 1310  TROPONINI <0.03   ------------------------------------------------------------------------------------------------------------------ Invalid input(s): POCBNP  ---------------------------------------------------------------------------------------------------------------  Urinalysis    Component Value Date/Time   COLORURINE AMBER (A) 09/28/2016 2110   APPEARANCEUR HAZY (A) 09/28/2016 2110   LABSPEC >1.046 (H) 09/28/2016 2110   PHURINE 5.0 09/28/2016 2110   GLUCOSEU NEGATIVE 09/28/2016 2110   HGBUR NEGATIVE 09/28/2016 2110   BILIRUBINUR MODERATE (A) 09/28/2016 2110   KETONESUR NEGATIVE 09/28/2016 2110   PROTEINUR 100 (A) 09/28/2016 2110   NITRITE NEGATIVE 09/28/2016 2110   LEUKOCYTESUR NEGATIVE 09/28/2016 2110     RADIOLOGY: Dg Chest Port 1 View  Result Date: 04/06/2018 CLINICAL DATA:  Patient with cough EXAM: PORTABLE CHEST 1 VIEW COMPARISON:  Chest radiograph 09/20/2016 FINDINGS: Monitoring leads overlie the patient. Stable cardiomegaly. Heterogeneous opacities right mid lung. Bibasilar opacities. No pleural effusion or pneumothorax. IMPRESSION: Right mid lung opacities may represent atelectasis or infection. Bibasilar atelectasis. Electronically Signed   By: Lovey Newcomer M.D.   On: 04/06/2018 12:43    EKG: Orders placed or performed during the hospital encounter of 09/28/16  . ED EKG 12-Lead  . ED EKG 12-Lead    IMPRESSION AND PLAN: *Acute community-acquired aspiration pneumonia Secondary to acute choking, noted history of chronic dysphasia Admit to regular nursing for bed, n.p.o. for now, pneumonia protocol, empiric Unasyn, follow-up  on cultures, supplemental oxygen wean as tolerated  *Acute COPD  exacerbation Exacerbated by above IV Solu-Medrol with tapering as tolerated, mucolytic agents, antibiotics per above, respiratory therapy to see, supplemental oxygen with weaning as tolerated  *Chronic severe cerebral palsy with functional quadriplegia/bedbound state/chronic dysphasia Increase nursing care PRN, aspiration/fall/skin care precautions while in house, head of the bed at 30 degrees at all times, speech therapy to see  *Acute hypokalemia Replete with p.o. potassium, check magnesium level, BMP in the morning  *Chronic seizure disorder Seizure precautions, continue Dilantin, check Dilantin level in the morning  *Chronic GERD PPI daily   All the records are reviewed and case discussed with ED provider. Management plans discussed with the patient, family and they are in agreement.  CODE STATUS:full Code Status History    Date Active Date Inactive Code Status Order ID Comments User Context   09/29/2016 0539 10/04/2016 1632 Full Code [id number]  Harrie Foreman, MD Inpatient       TOTAL TIME TAKING CARE OF THIS PATIENT: 40 minutes.    Avel Peace Joanna Borawski M.D on 04/06/2018   Between 7am to 6pm - Pager - [phone]  After 6pm go to [email or web address] - password [email or web address] [facility]  Office  [phone]  CC: Primary care physician; Marguerita Merles, MD   Note: This dictation was prepared with Dragon dictation along with smaller phrase technology. Any transcriptional errors that result from this process are unintentional.

## 2018-04-06 NOTE — ED Notes (Addendum)
ED TO INPATIENT HANDOFF REPORT  ED Nurse Name and Phone #: Reola Mosher [phone] S Name/Age/Gender Rosezetta Schlatter 58 y.o. female Room/Bed: ED17A/ED17A  Code Status   Code Status: Prior  Home/SNF/Other Home {Patient oriented to:Non-verbal- recognizes her family Is this baseline? Yes   Triage Complete: Triage complete  Chief Complaint possible choking on congestion  Triage Note Pt brought in via [facility] from home where her sister takes care of her. Pt has had issues with clearing her secretions recently. Pt has had multiple "colds," recently. Pt in NAD at this time and sister who cares for stating she is at her baseline. Pt has choked on secretions due to the inability to cough strong enough to clear them.   Allergies No Known Allergies  Level of Care/Admitting Diagnosis ED Disposition    ED Disposition Condition [facility] Area: [facility] [[id number]]  Level of Care: Med-Surg [16]  Diagnosis: Aspiration pneumonia [facility]) [[id number]]  Admitting Physician: Gorden Harms [[id number]]  Attending Physician: Gorden Harms [[id number]]  Estimated length of stay: past midnight tomorrow  Certification:: I certify this patient will need inpatient services for at least 2 midnights  PT Class (Do Not Modify): Inpatient [101]  PT Acc Code (Do Not Modify): Private [1]       B Medical/Surgery History Past Medical History:  Diagnosis Date  . Arthritis   . Cerebral palsy ([place])   . COPD (chronic obstructive pulmonary disease) ([place])   . Seizures ([place])    Past Surgical History:  Procedure Laterality Date  . none       A IV Location/Drains/Wounds Patient Lines/Drains/Airways Status   Active Line/Drains/Airways    Name:   Placement date:   Placement time:   Site:   Days:   Peripheral IV 04/06/18 Left Antecubital   04/06/18    1235    Antecubital   less than 1   Pressure Injury 10/02/16 Stage I -  Intact skin with non-blanchable redness of a  localized area usually over a bony prominence.   10/02/16    0800     551   Pressure Injury 10/03/16 Deep Tissue Injury - Purple or maroon localized area of discolored intact skin or blood-filled blister due to damage of underlying soft tissue from pressure and/or shear.   10/03/16    1622     550   Pressure Injury 10/03/16 Stage II -  Partial thickness loss of dermis presenting as a shallow open ulcer with a red, pink wound bed without slough.   10/03/16    1623     550          Intake/Output Last 24 hours  Intake/Output Summary (Last 24 hours) at 04/06/2018 1545 Last data filed at 04/06/2018 1450 Gross per 24 hour  Intake 100 ml  Output -  Net 100 ml    Labs/Imaging Results for orders placed or performed during the hospital encounter of 04/06/18 (from the past 48 hour(s))  CBC with Differential     Status: None   Collection Time: 04/06/18 12:27 PM  Result Value Ref Range   WBC 7.2 4.0 - 10.5 K/uL   RBC 4.76 3.87 - 5.11 MIL/uL   Hemoglobin 14.7 12.0 - 15.0 g/dL   HCT 45.8 36.0 - 46.0 %   MCV 96.2 80.0 - 100.0 fL   MCH 30.9 26.0 - 34.0 pg   MCHC 32.1 30.0 - 36.0 g/dL   RDW 15.3 11.5 - 15.5 %  Platelets 280 150 - 400 K/uL   nRBC 0.0 0.0 - 0.2 %   Neutrophils Relative % 73 %   Neutro Abs 5.1 1.7 - 7.7 K/uL   Lymphocytes Relative 17 %   Lymphs Abs 1.2 0.7 - 4.0 K/uL   Monocytes Relative 6 %   Monocytes Absolute 0.5 0.1 - 1.0 K/uL   Eosinophils Relative 4 %   Eosinophils Absolute 0.3 0.0 - 0.5 K/uL   Basophils Relative 0 %   Basophils Absolute 0.0 0.0 - 0.1 K/uL   Immature Granulocytes 0 %   Abs Immature Granulocytes 0.02 0.00 - 0.07 K/uL    Comment: Performed at [facility], [address] [place], [place] [address]  Comprehensive metabolic panel     Status: Abnormal   Collection Time: 04/06/18  1:10 PM  Result Value Ref Range   Sodium 144 135 - 145 mmol/L   Potassium 3.2 (L) 3.5 - 5.1 mmol/L   Chloride 116 (H) 98 - 111 mmol/L   CO2 22 22 - 32 mmol/L    Glucose, Bld 94 70 - 99 mg/dL   BUN 7 6 - 20 mg/dL   Creatinine, Ser 0.31 (L) 0.44 - 1.00 mg/dL   Calcium 8.0 (L) 8.9 - 10.3 mg/dL   Total Protein 6.8 6.5 - 8.1 g/dL   Albumin 3.4 (L) 3.5 - 5.0 g/dL   AST 15 15 - 41 U/L   ALT 9 0 - 44 U/L   Alkaline Phosphatase 85 38 - 126 U/L   Total Bilirubin 0.4 0.3 - 1.2 mg/dL   GFR calc non Af Amer >60 >60 mL/min   GFR calc Af Amer >60 >60 mL/min   Anion gap 6 5 - 15    Comment: Performed at [facility], [address] [place], [place] [address]  Troponin I -     Status: None   Collection Time: 04/06/18  1:10 PM  Result Value Ref Range   Troponin I <0.03 <0.03 ng/mL    Comment: Performed at [facility], [address] [place],  [address]   Dg Chest Port 1 View  Result Date: 04/06/2018 CLINICAL DATA:  Patient with cough EXAM: PORTABLE CHEST 1 VIEW COMPARISON:  Chest radiograph 09/20/2016 FINDINGS: Monitoring leads overlie the patient. Stable cardiomegaly. Heterogeneous opacities right mid lung. Bibasilar opacities. No pleural effusion or pneumothorax. IMPRESSION: Right mid lung opacities may represent atelectasis or infection. Bibasilar atelectasis. Electronically Signed   By: Lovey Newcomer M.D.   On: 04/06/2018 12:43    Pending Labs Unresulted Labs (From admission, onward)    Start     Ordered   04/07/18 0500  Magnesium  Tomorrow morning,   STAT     04/06/18 1506   04/07/18 7371  Basic metabolic panel  Tomorrow morning,   STAT     04/06/18 1506   Signed and Held  HIV antibody (Routine Screening)  Once,   R     Signed and Held   Signed and Held  Culture, blood (routine x 2) Call MD if unable to obtain prior to antibiotics being given  BLOOD CULTURE X 2,   R    Comments:  If blood cultures drawn in Emergency Department - Do not draw and cancel order    Signed and Held   Signed and Held  Culture, sputum-assessment  Once,   R     Signed and Held   Signed and Held  Gram stain  Once,   R  Signed and Held    Signed and Held  Strep pneumoniae urinary antigen  Once,   R     Signed and Held   Signed and Held  Legionella Pneumophila Serogp 1 Ur Ag  Once,   R     Signed and Held   Signed and Held  HIV antibody (Routine Testing)  Once,   R     Signed and Held   Signed and Held  CBC  (enoxaparin (LOVENOX)    CrCl >/= 30 ml/min)  Once,   R    Comments:  Baseline for enoxaparin therapy IF NOT ALREADY DRAWN.  Notify MD if PLT < 100 K.    Signed and Held   Signed and Held  Creatinine, serum  (enoxaparin (LOVENOX)    CrCl >/= 30 ml/min)  Once,   R    Comments:  Baseline for enoxaparin therapy IF NOT ALREADY DRAWN.    Signed and Held   Signed and Held  Creatinine, serum  (enoxaparin (LOVENOX)    CrCl >/= 30 ml/min)  Weekly,   R    Comments:  while on enoxaparin therapy    Signed and Held   Signed and Held  Phenytoin level, total  Tomorrow morning,   R     Signed and Held          Vitals/Pain Today's Vitals   04/06/18 1245 04/06/18 1300 04/06/18 1400 04/06/18 1500  BP: 114/75 115/79 128/79 117/76  Pulse: 98 99 (!) 110 (!) 114  Resp: 18 20 (!) 24 (!) 25  Temp:      TempSrc:      SpO2: 100% 100% 100% 100%    Isolation Precautions No active isolations  Medications Medications  potassium chloride (KLOR-CON) packet 40 mEq (0 mEq Oral Hold 04/06/18 1521)  Ampicillin-Sulbactam (UNASYN) 3 g in sodium chloride 0.9 % 100 mL IVPB (0 g Intravenous Stopped 04/06/18 1450)    Mobility non-ambulatory     Focused Assessments Pulmonary Assessment Handoff:  Lung sounds:   O2 Device: Room Air        R Recommendations: See Admitting Provider Note  Report given to: Lyn  Additional Notes:

## 2018-04-06 NOTE — ED Notes (Signed)
Nurse attempted to call report and waited on the phone for over 5 minutes.

## 2018-04-06 NOTE — ED Notes (Signed)
Nurse asked Dr. Corky Downs about PO for pt. Pt is to be NPO until a swallow evaluation can be done. Pt's sister given swabs and moisturizer for lips. Pt in NAD at this time.

## 2018-04-06 NOTE — Progress Notes (Signed)
Pharmacy Antibiotic Note  Roberta Ramos is a 58 y.o. female admitted on 04/06/2018 with pneumonia.  Pharmacy has been consulted for Unasyn dosing.  Plan: Will order Unasyn 3 g q6H. Adjust dose if CrCl < 30 ml/min.   Height: 5\' 4"  (162.6 cm) Weight: 62 lb 9.8 oz (28.4 kg) IBW/kg (Calculated) : 54.7  Temp (24hrs), Avg:98.2 F (36.8 C), Min:97.7 F (36.5 C), Max:98.6 F (37 C)  Recent Labs  Lab 04/06/18 1227 04/06/18 1310  WBC 7.2  --   CREATININE  --  0.31*    Estimated Creatinine Clearance: 34.8 mL/min (A) (by C-G formula based on SCr of 0.31 mg/dL (L)).    No Known Allergies  Antimicrobials this admission: 3/5 Unasyn >>    Dose adjustments this admission: None  Microbiology results: 3/5 BCx: pending  Thank you for allowing pharmacy to be a part of this patient's care.  Oswald Hillock, PharmD, BCPS Clinical Pharmacist 04/06/2018 5:36 PM

## 2018-04-06 NOTE — ED Provider Notes (Signed)
[facility] Emergency Department Provider Note   ____________________________________________    I have reviewed the triage vital signs and the nursing notes.   HISTORY  Chief Complaint Choking  Patient is nonverbal  HPI Roberta Ramos is a 58 y.o. female with a history of cerebral palsy, COPD who is bedbound brought in today by her sister who is her caregiver and medical power of attorney.  Sister reports this morning the patient has been choking on her secretions, this has not happened before.  No reports of fevers.  Patient is able to eat pured foods typically.  Sister does report she has had nasal congestion recently  Past Medical History:  Diagnosis Date  . Arthritis   . Cerebral palsy ([place])   . COPD (chronic obstructive pulmonary disease) ([place])   . Seizures [facility])     Patient Active Problem List   Diagnosis Date Noted  . Pressure injury of skin 10/01/2016  . Sepsis ([place]) 09/29/2016  . Protein-calorie malnutrition, severe 09/29/2016  . Calculus of gallbladder without cholecystitis without obstruction     Past Surgical History:  Procedure Laterality Date  . none      Prior to Admission medications   Medication Sig Start Date End Date Taking? Authorizing Provider  albuterol (PROVENTIL) (2.5 MG/3ML) 0.083% nebulizer solution Take 2.5 mg by nebulization every 4 (four) hours as needed for wheezing or shortness of breath.    Yes [provider]  budesonide (PULMICORT) 0.5 MG/2ML nebulizer solution Take 0.5 mg by nebulization 2 (two) times daily.   Yes [provider]  diazepam (VALIUM) 5 MG tablet Take 5 mg by mouth 3 (three) times daily as needed for anxiety or muscle spasms.   Yes [provider]  ipratropium (ATROVENT) 0.02 % nebulizer solution Take 0.5 mg by nebulization every 4 (four) hours as needed for wheezing or shortness of breath.    Yes [provider]  loratadine (CLARITIN) 5 MG/5ML syrup Take  5 mg by mouth at bedtime.   Yes [provider]  phenytoin (DILANTIN) 125 MG/5ML suspension Take 87.5 mg by mouth 2 (two) times daily.   Yes [provider]  polyethylene glycol (MIRALAX / GLYCOLAX) packet Take 17 g by mouth daily as needed for mild constipation or moderate constipation.    Yes [provider]  ranitidine (ZANTAC) 150 MG/10ML syrup Take 112.5 mg by mouth 2 (two) times daily.   Yes [provider]  simethicone (MYLICON) 80 MG chewable tablet Chew 80 mg by mouth as needed for flatulence.   Yes [provider]     Allergies Patient has no known allergies.  Family History  Problem Relation Age of Onset  . Hypertension Mother   . Diabetes Mellitus II Brother     Social History Social History   Tobacco Use  . Smoking status: Never Smoker  . Smokeless tobacco: Never Used  Substance Use Topics  . Alcohol use: No  . Drug use: No    Unable to obtain significant review of Systems due to patient being nonverbal  No reports of fevers Choking as above   ____________________________________________   PHYSICAL EXAM:  VITAL SIGNS: ED Triage Vitals  Enc Vitals Group     BP 04/06/18 1230 130/79     Pulse Rate 04/06/18 1159 100     Resp 04/06/18 1159 (!) 24     Temp 04/06/18 1159 97.7 F (36.5 C)     Temp Source 04/06/18 1159 Axillary  SpO2 04/06/18 1150 94 %     Weight --      Height --      Head Circumference --      Peak Flow --      Pain Score --      Pain Loc --      Pain Edu? --      Excl. in [place]? --     Constitutional: Alert, significantly contracted Eyes: Conjunctivae are normal.  Head: Atraumatic. Nose: No congestion/rhinnorhea. Mouth/Throat: Mucous membranes are moist.    Cardiovascular: Normal rate, regular rhythm.  Good peripheral circulation. Respiratory: Normal respiratory effort.  No retractions. Lungs CTAB.  No wheezing Gastrointestinal: Soft and nontender. No distention.   Musculoskeletal:  Severely contracted extremities Neurologic: Sister reports she is at her baseline Skin:  Skin is warm, dry    ____________________________________________   LABS (all labs ordered are listed, but only abnormal results are displayed)  Labs Reviewed  COMPREHENSIVE METABOLIC PANEL - Abnormal; Notable for the following components:      Result Value   Potassium 3.2 (*)    Chloride 116 (*)    Creatinine, Ser 0.31 (*)    Calcium 8.0 (*)    Albumin 3.4 (*)    All other components within normal limits  CBC WITH DIFFERENTIAL/PLATELET  TROPONIN I   ____________________________________________  EKG  None ____________________________________________  RADIOLOGY  Chest x-ray shows right midlung opacities possible atelectasis or infection ____________________________________________   PROCEDURES  Procedure(s) performed: No  Procedures   Critical Care performed: No ____________________________________________   INITIAL IMPRESSION / ASSESSMENT AND PLAN / ED COURSE  Pertinent labs & imaging results that were available during my care of the patient were reviewed by me and considered in my medical decision making (see chart for details).  Patient with cerebral palsy, severely contracted presents with choking on secretions, I witnessed this while in the room.  We had to suction extensively as the patient was struggling significantly to breathe  Chest x-ray demonstrates right midlung opacities question atelectasis versus infection versus aspiration.  Will cover with IV Unasyn  Labwork is overall reassuring  Patient requires admission for swallowing eval, treatment of aspiration, discussed with hospitalist service    ____________________________________________   FINAL CLINICAL IMPRESSION(S) / ED DIAGNOSES  Final diagnoses:  Aspiration pneumonia of right middle lobe, unspecified aspiration pneumonia type [facility])        Note:  This document was prepared using Dragon  voice recognition software and may include unintentional dictation errors.   Lavonia Drafts, MD 04/06/18 1415

## 2018-04-07 DIAGNOSIS — J690 Pneumonitis due to inhalation of food and vomit: Secondary | ICD-10-CM | POA: Diagnosis not present

## 2018-04-07 LAB — MAGNESIUM: Magnesium: 2 mg/dL (ref 1.7–2.4)

## 2018-04-07 LAB — PHENYTOIN LEVEL, TOTAL: Phenytoin Lvl: 21.6 ug/mL — ABNORMAL HIGH (ref 10.0–20.0)

## 2018-04-07 LAB — BASIC METABOLIC PANEL
Anion gap: 6 (ref 5–15)
BUN: <5 mg/dL — ABNORMAL LOW (ref 6–20)
CHLORIDE: 114 mmol/L — AB (ref 98–111)
CO2: 23 mmol/L (ref 22–32)
CREATININE: 0.31 mg/dL — AB (ref 0.44–1.00)
Calcium: 8.2 mg/dL — ABNORMAL LOW (ref 8.9–10.3)
GFR calc non Af Amer: >60 mL/min (ref >60)
Glucose, Bld: 104 mg/dL — ABNORMAL HIGH (ref 70–99)
Potassium: 3.3 mmol/L — ABNORMAL LOW (ref 3.5–5.1)
Sodium: 143 mmol/L (ref 135–145)

## 2018-04-07 LAB — HIV ANTIBODY (ROUTINE TESTING W REFLEX): HIV Screen 4th Generation wRfx: NONREACTIVE

## 2018-04-07 MED ORDER — AMOXICILLIN-POT CLAVULANATE 400-57 MG/5ML PO SUSR
875.0000 mg | Freq: Two times a day (BID) | ORAL | Status: DC
  Administered 2018-04-07: 10:00:00 875 mg via ORAL
  Filled 2018-04-07 (×2): qty 10.9

## 2018-04-07 MED ORDER — DIAZEPAM 5 MG/ML IJ SOLN
5.0000 mg | Freq: Three times a day (TID) | INTRAMUSCULAR | Status: DC | PRN
  Administered 2018-04-07: 5 mg via INTRAVENOUS
  Filled 2018-04-07: qty 2

## 2018-04-07 MED ORDER — PHENYTOIN 125 MG/5ML PO SUSP: 87.5000 mg | Freq: Two times a day (BID) | ORAL | 12 refills | Status: AC

## 2018-04-07 MED ORDER — AMOXICILLIN-POT CLAVULANATE 400-57 MG/5ML PO SUSR: 875.0000 mg | Freq: Two times a day (BID) | ORAL | 0 refills | Status: AC

## 2018-04-07 MED ORDER — POTASSIUM CHLORIDE 10 MEQ/100ML IV SOLN
10.0000 meq | INTRAVENOUS | Status: AC
  Administered 2018-04-07 (×3): 10 meq via INTRAVENOUS
  Filled 2018-04-07 (×3): qty 100

## 2018-04-07 NOTE — Care Management Note (Signed)
Case Management Note  Patient Details  Name: Roberta Ramos MRN: [id number] Date of Birth: [birth year]  Subjective/Objective:   Patient to be discharged per MD order. Orders in place for home health services. Spoke to sister Levada Dy, who is POA. Patient lives with Levada Dy and is currently active with home health services via [facility]. Notified Cheryl from [facility] of pending discharge and resumption of care. Patient has all needed DME including hospital bed and hoyer lift as she is bedbound. They are unsure about transportation but either will attempt to transport her or get EMS.                  Action/Plan:   Expected Discharge Date:  04/07/18               Expected Discharge Plan:  [place]  In-House Referral:     Discharge planning Services  CM Consult  Post Acute Care Choice:    Choice offered to:     DME Arranged:    DME Agency:     HH Arranged:  RN, PT, Nurse's Aide, Speech Therapy HH Agency:  [place]  Status of Service:  Completed, signed off  If discussed at [place] of Stay Meetings, dates discussed:    Additional Comments:  Latanya Maudlin, RN 04/07/2018, 10:51 AM

## 2018-04-07 NOTE — Discharge Summary (Signed)
[facility] [place] NAME: Roberta Ramos    MR#:  [id number]  DATE OF BIRTH:  11/22/1960  DATE OF ADMISSION:  04/06/2018 ADMITTING PHYSICIAN: Gorden Harms, MD  DATE OF DISCHARGE: 04/07/2018  PRIMARY CARE PHYSICIAN: Marguerita Merles, MD    ADMISSION DIAGNOSIS:  Aspiration pneumonia of right middle lobe, unspecified aspiration pneumonia type ([place]) [J69.0]  DISCHARGE DIAGNOSIS:  Active Problems:   Aspiration pneumonia ([place])   SECONDARY DIAGNOSIS:   Past Medical History:  Diagnosis Date  . Arthritis   . Cerebral palsy ([place])   . COPD (chronic obstructive pulmonary disease) ([place])   . 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   1.  Acute hypoxic respiratory failure in the setting of aspiration pneumonia: Patient weaned off of oxygen  2.  Aspiration pneumonia: Patient will continue on dysphagia 1 diet after evaluation by speech pathologist.  She will continue antibiotics for aspiration pneumonia.  3.  Cerebral palsy: Patient is at her baseline  4.  Hypokalemia: This was repleted  5.  Chronic left shoulder wound stage III: Foam dressing to site. Change every 3 days and prn. Turn patient off of area to the greatest extent possible  6.  History of seizure disorder: Entin level was minimally elevated.  Patient sister was asked to hold tonight's Dilantin dose as well as tomorrow.  Patient needs a free Dilantin level checked by her PCP within 1 week time. DISCHARGE CONDITIONS AND DIET:   Stable for discharge on dysphagia 1 diet with aspiration precautions  CONSULTS OBTAINED:    DRUG ALLERGIES:  No Known Allergies  DISCHARGE MEDICATIONS:   Allergies as of 04/07/2018   No Known Allergies     Medication List    TAKE these medications   albuterol (2.5 MG/3ML) 0.083% nebulizer  solution Commonly known as:  PROVENTIL Take 2.5 mg by nebulization every 4 (four) hours as needed for wheezing or shortness of breath.   amoxicillin-clavulanate 400-57 MG/5ML suspension Commonly known as:  AUGMENTIN Take 10.9 mLs (875 mg total) by mouth every 12 (twelve) hours for 5 days.   budesonide 0.5 MG/2ML nebulizer solution Commonly known as:  PULMICORT Take 0.5 mg by nebulization 2 (two) times daily.   diazepam 5 MG tablet Commonly known as:  VALIUM Take 5 mg by mouth 3 (three) times daily as needed for anxiety or muscle spasms.   ipratropium 0.02 % nebulizer solution Commonly known as:  ATROVENT Take 0.5 mg by nebulization every 4 (four) hours as needed for wheezing or shortness of breath.   loratadine 5 MG/5ML syrup Commonly known as:  CLARITIN Take 5 mg by mouth at bedtime.   phenytoin 125 MG/5ML suspension Commonly known as:  DILANTIN Take 3.5 mLs (87.5 mg total) by mouth 2 (two) times daily. Please do not take dose for evening of March 6 and morning of March 7 then resume dose What changed:  additional instructions   polyethylene glycol packet Commonly known as:  MIRALAX / GLYCOLAX Take 17 g by mouth daily as needed for mild constipation or moderate constipation.   ranitidine 150 MG/10ML syrup Commonly known as:  ZANTAC Take 112.5 mg by mouth 2 (two) times daily.   simethicone 80 MG chewable tablet Commonly known as:  MYLICON Chew 80 mg by mouth as needed for flatulence.  Today   CHIEF COMPLAINT:   Sister is at bedside and states that patient is at her baseline   VITAL SIGNS:  Blood pressure 99/72, pulse 89, temperature 98 F (36.7 C), temperature source Oral, resp. rate 15, height 5\' 4"  (1.626 m), weight 28.4 kg, SpO2 99 %.   REVIEW OF SYSTEMS:  Review of Systems  Unable to perform ROS: Patient nonverbal     PHYSICAL EXAMINATION:  GENERAL:  58 y.o.-year-old patient lying in the bed with no acute distress.  NECK:  Supple, no  jugular venous distention. No thyroid enlargement, no tenderness.  LUNGS: Normal breath sounds bilaterally, no wheezing, rales,rhonchi  No use of accessory muscles of respiration.  CARDIOVASCULAR: S1, S2 normal. No murmurs, rubs, or gallops.  ABDOMEN: Soft, non-tender, non-distended. Bowel sounds present. No organomegaly or mass.  EXTREMITIES: No pedal edema, cyanosis, or clubbing.  PSYCHIATRIC: The patient is alert nonverbal SKIN: chronic left shoulder wound  Patient with CP contracted hands and legs DATA REVIEW:   CBC Recent Labs  Lab 04/06/18 1227  WBC 7.2  HGB 14.7  HCT 45.8  PLT 280    Chemistries  Recent Labs  Lab 04/06/18 1310 04/07/18 0421  NA 144 143  K 3.2* 3.3*  CL 116* 114*  CO2 22 23  GLUCOSE 94 104*  BUN 7 <5*  CREATININE 0.31* 0.31*  CALCIUM 8.0* 8.2*  MG  --  2.0  AST 15  --   ALT 9  --   ALKPHOS 85  --   BILITOT 0.4  --     Cardiac Enzymes Recent Labs  Lab 04/06/18 1310  TROPONINI <0.03    Microbiology Results  @[email or web address] @  RADIOLOGY:  Dg Chest Port 1 View  Result Date: 04/06/2018 CLINICAL DATA:  Patient with cough EXAM: PORTABLE CHEST 1 VIEW COMPARISON:  Chest radiograph 09/20/2016 FINDINGS: Monitoring leads overlie the patient. Stable cardiomegaly. Heterogeneous opacities right mid lung. Bibasilar opacities. No pleural effusion or pneumothorax. IMPRESSION: Right mid lung opacities may represent atelectasis or infection. Bibasilar atelectasis. Electronically Signed   By: Lovey Newcomer M.D.   On: 04/06/2018 12:43      Allergies as of 04/07/2018   No Known Allergies     Medication List    TAKE these medications   albuterol (2.5 MG/3ML) 0.083% nebulizer solution Commonly known as:  PROVENTIL Take 2.5 mg by nebulization every 4 (four) hours as needed for wheezing or shortness of breath.   amoxicillin-clavulanate 400-57 MG/5ML suspension Commonly known as:  AUGMENTIN Take 10.9 mLs (875 mg total) by mouth every 12 (twelve) hours for 5  days.   budesonide 0.5 MG/2ML nebulizer solution Commonly known as:  PULMICORT Take 0.5 mg by nebulization 2 (two) times daily.   diazepam 5 MG tablet Commonly known as:  VALIUM Take 5 mg by mouth 3 (three) times daily as needed for anxiety or muscle spasms.   ipratropium 0.02 % nebulizer solution Commonly known as:  ATROVENT Take 0.5 mg by nebulization every 4 (four) hours as needed for wheezing or shortness of breath.   loratadine 5 MG/5ML syrup Commonly known as:  CLARITIN Take 5 mg by mouth at bedtime.   phenytoin 125 MG/5ML suspension Commonly known as:  DILANTIN Take 3.5 mLs (87.5 mg total) by mouth 2 (two) times daily. Please do not take dose for evening of March 6 and morning of March 7 then resume dose What changed:  additional instructions   polyethylene glycol packet Commonly known as:  MIRALAX / GLYCOLAX Take 17 g  by mouth daily as needed for mild constipation or moderate constipation.   ranitidine 150 MG/10ML syrup Commonly known as:  ZANTAC Take 112.5 mg by mouth 2 (two) times daily.   simethicone 80 MG chewable tablet Commonly known as:  MYLICON Chew 80 mg by mouth as needed for flatulence.          Management plans discussed with the patient's family and they are in agreement. Stable for discharge    Patient should follow up with pcp   CODE STATUS:     Code Status Orders  (From admission, onward)         Start     Ordered   04/06/18 1642  Full code  Continuous     04/06/18 1642        Code Status History    Date Active Date Inactive Code Status Order ID Comments User Context   09/29/2016 0539 10/04/2016 1632 Full Code [id number]  Harrie Foreman, MD Inpatient      TOTAL TIME TAKING CARE OF THIS PATIENT: 38 minutes.    Note: This dictation was prepared with Dragon dictation along with smaller phrase technology. Any transcriptional errors that result from this process are unintentional.  Sarinah Doetsch M.D on 04/07/2018 at 10:51  AM  Between 7am to 6pm - Pager - [phone] After 6pm go to [email or web address] - password [email or web address] [place]  Office  [phone]  CC: Primary care physician; Marguerita Merles, MD

## 2018-04-07 NOTE — Consult Note (Signed)
[facility] Nurse wound consult note Patient receiving care in [facility].  Her sister that cares for her at home is present. Reason for Consult: chronic left shoulder wound Wound type: healing PI. Today appears as a stage 3 Pressure Injury POA: Yes Measurement: 0.3 cm x 0.2 cm x no measurable depth; just needs re-epithelialization Wound bed: The wound is surrounded by hypopigmented tissue, but healed over with epithelial tissue. Drainage (amount, consistency, odor) none on existing foam dressing Periwound: hypopigmented, no induration, no erythema Dressing procedure/placement/frequency: Foam dressing to site. Change every 3 days and prn. Turn patient off of area to the greatest extent possible.  The sister tells me she "favors lying on her left side" and she is contracted x 4 extremities. Monitor the wound area(s) for worsening of condition such as: Signs/symptoms of infection,  Increase in size,  Development of or worsening of odor, Development of pain, or increased pain at the affected locations.  Notify the medical team if any of these develop.  Thank you for the consult.  Discussed plan of care with the patient and bedside nurse.  [facility] nurse will not follow at this time.  Please re-consult the [facility] team if needed.  Val Riles, RN, MSN, CWOCN, CNS-BC, pager [phone]

## 2018-04-07 NOTE — Evaluation (Signed)
Clinical/Bedside Swallow Evaluation Patient Details  Name: Roberta Ramos MRN: [id number] Date of Birth: 01-Jul-1960  Today's Date: 04/07/2018 Time: SLP Start Time (ACUTE ONLY): 0900 SLP Stop Time (ACUTE ONLY): 1000 SLP Time Calculation (min) (ACUTE ONLY): 60 min  Past Medical History:  Past Medical History:  Diagnosis Date  . Arthritis   . Cerebral palsy ([place])   . COPD (chronic obstructive pulmonary disease) ([place])   . Seizures ([place])    Past Surgical History:  Past Surgical History:  Procedure Laterality Date  . none     HPI:  H&P 06/06/2018: "Roberta Ramos  is a 58 y.o. 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, chronic left shoulder pressure sore, presented to the emergency room with choking spells, brought to emergency room via EMS, noted to be hypoxic, subsequently placed on oxygen, in the emergency room patient was found to have potassium of 3.2, chest x-ray noted for right-sided pneumonia, patient valuated at the bedside in the emergency room, the patient's sister is present, patient is poor historian due to severe cerebral palsy, patient now be admitted for acute community-acquired aspiration pneumonia."    The patient's sister (primary caregiver) reports that the patient eats a puree diet with thin liquids (using sippy cup).  She reports no changes in her swallowing prior to onset of pneumonia.   Assessment / Plan / Recommendation Clinical Impression  The patient appears to be safe to return to her usual, puree diet.  The patient's sister/caregiver was observed feeding her applesauce, ice cream, and liquid medication.  A sippy cup was not available to observe her usual method of drinking.  However, the ice cream can serve as assessing thin liquid swallows.  The patient had one cough across the multiple trials.  Her sister reports that the patient is at her baseline regarding swallowing.  The patient does not  appear to be significant risk for prandial aspiration.  Education given to insure the sister knows what to look for in terms of judging safety of swallowing.  Will sign off, please reconsult speech if any concerns regarding swallowing arise. SLP Visit Diagnosis: Dysphagia, oral phase (R13.11)    Aspiration Risk  No limitations    Diet Recommendation Dysphagia 1 (Puree);Thin liquid   Liquid Administration via: Other (Comment)(sippy cup) Medication Administration: Crushed with puree Supervision: Comment(Total assist)    Other  Recommendations     Follow up Recommendations        Frequency and Duration            Prognosis Prognosis for Safe Diet Advancement: Good      Swallow Study   General Date of Onset: 04/06/18 HPI: H&P 06/06/2018: "Roberta Ramos  is a 58 y.o. 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, chronic left shoulder pressure sore, presented to the emergency room with choking spells, brought to emergency room via EMS, noted to be hypoxic, subsequently placed on oxygen, in the emergency room patient was found to have potassium of 3.2, chest x-ray noted for right-sided pneumonia, patient valuated at the bedside in the emergency room, the patient's sister is present, patient is poor historian due to severe cerebral palsy, patient now be admitted for acute community-acquired aspiration pneumonia."  The patient's sister (primary caregiver) reports that the patient eats a puree diet with thin liquids (using sippy cup).  She reports no changes in her swallowing prior to onset of pneumonia. Type of Study: Bedside  Swallow Evaluation Previous Swallow Assessment: None Diet Prior to this Study: Dysphagia 1 (puree);Thin liquids Behavior/Cognition: Doesn't follow directions Self-Feeding Abilities: Total assist Patient Positioning: Upright in bed;Other (comment)(Assessed in her usual feeding position) Baseline  Vocal Quality: Normal Volitional Cough: Cognitively unable to elicit Volitional Swallow: Unable to elicit    Oral/Motor/Sensory Function Overall Oral Motor/Sensory Function: Severe impairment   Ice Chips     Thin Liquid Thin Liquid: Within functional limits Presentation: Spoon    Nectar Thick     Honey Thick     Puree Puree: Within functional limits Presentation: Spoon   Solid           Leroy Sea, MS/CCC- SLP  Valetta Fuller, [place] 04/07/2018,11:38 AM

## 2018-04-07 NOTE — Progress Notes (Signed)
Pt discharge home with sister via private vehicle

## 2018-04-10 LAB — CULTURE, BLOOD (ROUTINE X 2): Special Requests: ADEQUATE

## 2018-04-11 LAB — CULTURE, BLOOD (ROUTINE X 2)
Culture: NO GROWTH
Special Requests: ADEQUATE

## 2018-04-13 ENCOUNTER — Inpatient Hospital Stay
Admission: EM | Admit: 2018-04-13 | Discharge: 2018-04-16 | DRG: 153 | Disposition: A | Discharge status: Home / Self Care | Payer: Medicare Other | Attending: Internal Medicine | Admitting: Internal Medicine

## 2018-04-13 ENCOUNTER — Encounter: Payer: Self-pay | Admitting: Emergency Medicine

## 2018-04-13 ENCOUNTER — Other Ambulatory Visit: Payer: Self-pay

## 2018-04-13 ENCOUNTER — Emergency Department: Payer: Medicare Other

## 2018-04-13 DIAGNOSIS — Z79899 Other long term (current) drug therapy: Secondary | ICD-10-CM

## 2018-04-13 DIAGNOSIS — N390 Urinary tract infection, site not specified: Secondary | ICD-10-CM

## 2018-04-13 DIAGNOSIS — A419 Sepsis, unspecified organism: Secondary | ICD-10-CM

## 2018-04-13 DIAGNOSIS — Z7401 Bed confinement status: Secondary | ICD-10-CM | POA: Diagnosis not present

## 2018-04-13 DIAGNOSIS — M1990 Unspecified osteoarthritis, unspecified site: Secondary | ICD-10-CM | POA: Diagnosis present

## 2018-04-13 DIAGNOSIS — Z8701 Personal history of pneumonia (recurrent): Secondary | ICD-10-CM

## 2018-04-13 DIAGNOSIS — I959 Hypotension, unspecified: Secondary | ICD-10-CM | POA: Diagnosis present

## 2018-04-13 DIAGNOSIS — G809 Cerebral palsy, unspecified: Secondary | ICD-10-CM | POA: Diagnosis present

## 2018-04-13 DIAGNOSIS — J449 Chronic obstructive pulmonary disease, unspecified: Secondary | ICD-10-CM | POA: Diagnosis present

## 2018-04-13 DIAGNOSIS — E870 Hyperosmolality and hypernatremia: Secondary | ICD-10-CM | POA: Diagnosis present

## 2018-04-13 DIAGNOSIS — E860 Dehydration: Secondary | ICD-10-CM | POA: Diagnosis present

## 2018-04-13 DIAGNOSIS — E876 Hypokalemia: Secondary | ICD-10-CM | POA: Diagnosis present

## 2018-04-13 DIAGNOSIS — E43 Unspecified severe protein-calorie malnutrition: Secondary | ICD-10-CM

## 2018-04-13 DIAGNOSIS — Z7951 Long term (current) use of inhaled steroids: Secondary | ICD-10-CM | POA: Diagnosis not present

## 2018-04-13 DIAGNOSIS — G40909 Epilepsy, unspecified, not intractable, without status epilepticus: Secondary | ICD-10-CM | POA: Diagnosis present

## 2018-04-13 DIAGNOSIS — Z888 Allergy status to other drugs, medicaments and biological substances status: Secondary | ICD-10-CM | POA: Diagnosis not present

## 2018-04-13 DIAGNOSIS — R739 Hyperglycemia, unspecified: Secondary | ICD-10-CM | POA: Diagnosis present

## 2018-04-13 DIAGNOSIS — Z91011 Allergy to milk products: Secondary | ICD-10-CM | POA: Diagnosis not present

## 2018-04-13 DIAGNOSIS — E878 Other disorders of electrolyte and fluid balance, not elsewhere classified: Secondary | ICD-10-CM | POA: Diagnosis present

## 2018-04-13 DIAGNOSIS — J069 Acute upper respiratory infection, unspecified: Secondary | ICD-10-CM | POA: Diagnosis not present

## 2018-04-13 LAB — COMPREHENSIVE METABOLIC PANEL
ALT: 12 U/L (ref 0–44)
AST: 24 U/L (ref 15–41)
Albumin: 4.5 g/dL (ref 3.5–5.0)
Alkaline Phosphatase: 89 U/L (ref 38–126)
Anion gap: 10 (ref 5–15)
BUN: 10 mg/dL (ref 6–20)
CHLORIDE: 115 mmol/L — AB (ref 98–111)
CO2: 23 mmol/L (ref 22–32)
Calcium: 9.2 mg/dL (ref 8.9–10.3)
Creatinine, Ser: 0.47 mg/dL (ref 0.44–1.00)
GFR calc Af Amer: >60 mL/min (ref >60)
GFR calc non Af Amer: >60 mL/min (ref >60)
Glucose, Bld: 101 mg/dL — ABNORMAL HIGH (ref 70–99)
Potassium: 3.8 mmol/L (ref 3.5–5.1)
Sodium: 148 mmol/L — ABNORMAL HIGH (ref 135–145)
Total Bilirubin: 0.8 mg/dL (ref 0.3–1.2)
Total Protein: 8 g/dL (ref 6.5–8.1)

## 2018-04-13 LAB — URINALYSIS, COMPLETE (UACMP) WITH MICROSCOPIC
Bacteria, UA: NONE SEEN
Bilirubin Urine: NEGATIVE
GLUCOSE, UA: NEGATIVE mg/dL
Ketones, ur: NEGATIVE mg/dL
Nitrite: NEGATIVE
Protein, ur: NEGATIVE mg/dL
Specific Gravity, Urine: 1.02 (ref 1.005–1.030)
pH: 6 (ref 5.0–8.0)

## 2018-04-13 LAB — CBC WITH DIFFERENTIAL/PLATELET
ABS IMMATURE GRANULOCYTES: 0.02 10*3/uL (ref 0.00–0.07)
Basophils Absolute: 0.1 10*3/uL (ref 0.0–0.1)
Basophils Relative: 1 %
Eosinophils Absolute: 0.4 10*3/uL (ref 0.0–0.5)
Eosinophils Relative: 5 %
HCT: 39.2 % (ref 36.0–46.0)
Hemoglobin: 11.6 g/dL — ABNORMAL LOW (ref 12.0–15.0)
Immature Granulocytes: 0 %
Lymphocytes Relative: 31 %
Lymphs Abs: 2.3 10*3/uL (ref 0.7–4.0)
MCH: 31.4 pg (ref 26.0–34.0)
MCHC: 29.6 g/dL — ABNORMAL LOW (ref 30.0–36.0)
MCV: 105.9 fL — ABNORMAL HIGH (ref 80.0–100.0)
Monocytes Absolute: 0.8 10*3/uL (ref 0.1–1.0)
Monocytes Relative: 11 %
Neutro Abs: 3.7 10*3/uL (ref 1.7–7.7)
Neutrophils Relative %: 52 %
PLATELETS: 245 10*3/uL (ref 150–400)
RBC: 3.7 MIL/uL — ABNORMAL LOW (ref 3.87–5.11)
RDW: 15.6 % — ABNORMAL HIGH (ref 11.5–15.5)
WBC: 7.2 10*3/uL (ref 4.0–10.5)
nRBC: 0 % (ref 0.0–0.2)

## 2018-04-13 LAB — INFLUENZA PANEL BY PCR (TYPE A & B)
Influenza A By PCR: NEGATIVE
Influenza B By PCR: NEGATIVE

## 2018-04-13 LAB — PROCALCITONIN: Procalcitonin: <0.1 ng/mL

## 2018-04-13 LAB — MAGNESIUM: Magnesium: 2.2 mg/dL (ref 1.7–2.4)

## 2018-04-13 LAB — FOLATE: FOLATE: 11.2 ng/mL (ref >5.9)

## 2018-04-13 LAB — PHOSPHORUS: Phosphorus: 2.9 mg/dL (ref 2.5–4.6)

## 2018-04-13 LAB — LACTIC ACID, PLASMA: Lactic Acid, Venous: 2.3 mmol/L (ref 0.5–1.9)

## 2018-04-13 LAB — TROPONIN I: Troponin I: <0.03 ng/mL (ref <0.03)

## 2018-04-13 MED ORDER — SODIUM CHLORIDE 0.9 % IV BOLUS
1000.0000 mL | Freq: Once | INTRAVENOUS | Status: AC
  Administered 2018-04-13: 1000 mL via INTRAVENOUS

## 2018-04-13 MED ORDER — PHENYTOIN 125 MG/5ML PO SUSP
87.5000 mg | Freq: Two times a day (BID) | ORAL | Status: DC
  Administered 2018-04-14 – 2018-04-16 (×6): 87.5 mg via ORAL
  Filled 2018-04-13 (×2): qty 4
  Filled 2018-04-13: qty 3.5
  Filled 2018-04-13: qty 237
  Filled 2018-04-13 (×3): qty 4
  Filled 2018-04-13: qty 237
  Filled 2018-04-13: qty 3.5

## 2018-04-13 MED ORDER — LACTATED RINGERS IV SOLN
INTRAVENOUS | Status: DC
  Administered 2018-04-14: 01:00:00 via INTRAVENOUS

## 2018-04-13 MED ORDER — SODIUM CHLORIDE 0.9 % IV SOLN
1.0000 g | INTRAVENOUS | Status: DC
  Filled 2018-04-13: qty 10

## 2018-04-13 MED ORDER — SODIUM CHLORIDE 0.9% FLUSH
3.0000 mL | Freq: Once | INTRAVENOUS | Status: AC
  Administered 2018-04-14: 3 mL via INTRAVENOUS

## 2018-04-13 MED ORDER — RANITIDINE HCL 150 MG/10ML PO SYRP
112.5000 mg | ORAL_SOLUTION | Freq: Two times a day (BID) | ORAL | Status: DC
  Administered 2018-04-14 – 2018-04-15 (×4): 112.5 mg via ORAL
  Filled 2018-04-13 (×7): qty 10

## 2018-04-13 MED ORDER — MORPHINE SULFATE (PF) 4 MG/ML IV SOLN
4.0000 mg | INTRAVENOUS | Status: DC | PRN
  Administered 2018-04-16: 4 mg via INTRAVENOUS
  Filled 2018-04-13: qty 1

## 2018-04-13 MED ORDER — SENNOSIDES-DOCUSATE SODIUM 8.6-50 MG PO TABS: 1.0000 | ORAL_TABLET | Freq: Every evening | ORAL | Status: DC | PRN

## 2018-04-13 MED ORDER — BUDESONIDE 0.5 MG/2ML IN SUSP
0.5000 mg | Freq: Two times a day (BID) | RESPIRATORY_TRACT | Status: DC
  Administered 2018-04-14 – 2018-04-16 (×5): 0.5 mg via RESPIRATORY_TRACT
  Filled 2018-04-13 (×5): qty 2

## 2018-04-13 MED ORDER — ONDANSETRON HCL 4 MG PO TABS: 4.0000 mg | ORAL_TABLET | Freq: Four times a day (QID) | ORAL | Status: DC | PRN

## 2018-04-13 MED ORDER — MORPHINE SULFATE (PF) 2 MG/ML IV SOLN
2.0000 mg | INTRAVENOUS | Status: DC | PRN
  Administered 2018-04-14 – 2018-04-15 (×5): 2 mg via INTRAVENOUS
  Filled 2018-04-13 (×5): qty 1

## 2018-04-13 MED ORDER — ENOXAPARIN SODIUM 30 MG/0.3ML ~~LOC~~ SOLN
30.0000 mg | SUBCUTANEOUS | Status: DC
  Administered 2018-04-14 – 2018-04-15 (×2): 30 mg via SUBCUTANEOUS
  Filled 2018-04-13 (×2): qty 0.3

## 2018-04-13 MED ORDER — ACETAMINOPHEN 650 MG RE SUPP: 650.0000 mg | Freq: Four times a day (QID) | RECTAL | Status: DC | PRN

## 2018-04-13 MED ORDER — ACETAMINOPHEN 325 MG PO TABS: 650.0000 mg | ORAL_TABLET | Freq: Four times a day (QID) | ORAL | Status: DC | PRN

## 2018-04-13 MED ORDER — ORAL CARE MOUTH RINSE
15.0000 mL | Freq: Two times a day (BID) | OROMUCOSAL | Status: DC
  Administered 2018-04-14 – 2018-04-16 (×6): 15 mL via OROMUCOSAL

## 2018-04-13 MED ORDER — SODIUM CHLORIDE 0.9 % IV SOLN
1.0000 g | Freq: Once | INTRAVENOUS | Status: AC
  Administered 2018-04-13: 1 g via INTRAVENOUS
  Filled 2018-04-13: qty 10

## 2018-04-13 MED ORDER — MUPIROCIN 2 % EX OINT
TOPICAL_OINTMENT | Freq: Three times a day (TID) | CUTANEOUS | Status: DC
  Filled 2018-04-13: qty 22

## 2018-04-13 MED ORDER — DIAZEPAM 5 MG PO TABS
5.0000 mg | ORAL_TABLET | Freq: Three times a day (TID) | ORAL | Status: DC | PRN
  Administered 2018-04-14: 5 mg via ORAL
  Filled 2018-04-13: qty 1

## 2018-04-13 MED ORDER — SIMETHICONE 80 MG PO CHEW
80.0000 mg | CHEWABLE_TABLET | ORAL | Status: DC | PRN
  Filled 2018-04-13: qty 1

## 2018-04-13 MED ORDER — IPRATROPIUM-ALBUTEROL 0.5-2.5 (3) MG/3ML IN SOLN
3.0000 mL | RESPIRATORY_TRACT | Status: DC | PRN
  Administered 2018-04-14 – 2018-04-15 (×3): 3 mL via RESPIRATORY_TRACT
  Filled 2018-04-13 (×4): qty 3

## 2018-04-13 MED ORDER — MORPHINE SULFATE (PF) 4 MG/ML IV SOLN
4.0000 mg | Freq: Once | INTRAVENOUS | Status: AC
  Administered 2018-04-13: 4 mg via INTRAVENOUS
  Filled 2018-04-13: qty 1

## 2018-04-13 MED ORDER — BISACODYL 5 MG PO TBEC: 5.0000 mg | DELAYED_RELEASE_TABLET | Freq: Every day | ORAL | Status: DC | PRN

## 2018-04-13 MED ORDER — ONDANSETRON HCL 4 MG/2ML IJ SOLN: 4.0000 mg | Freq: Four times a day (QID) | INTRAMUSCULAR | Status: DC | PRN

## 2018-04-13 NOTE — ED Provider Notes (Signed)
[facility] Emergency Department Provider Note ____________________________________________   First MD Initiated Contact with Patient 04/13/18 1606     (approximate)  I have reviewed the triage vital signs and the nursing notes.   HISTORY  Chief Complaint Altered Mental Status  Level 5 caveat: History present illness limited due to cerebral palsy  HPI Roberta Ramos is a 58 y.o. female with PMH as noted below who presents with altered mental status over the last day, associated with tactile fever, as well as with increased rate of breathing.  The patient was recently admitted for an aspiration pneumonia.   Past Medical History:  Diagnosis Date  . Arthritis   . Cerebral palsy ([place])   . COPD (chronic obstructive pulmonary disease) ([place])   . Seizures [facility])     Patient Active Problem List   Diagnosis Date Noted  . Aspiration pneumonia ([place]) 04/06/2018  . Pressure injury of skin 10/01/2016  . Sepsis ([name]) 09/29/2016  . Protein-calorie malnutrition, severe 09/29/2016  . Calculus of gallbladder without cholecystitis without obstruction     Past Surgical History:  Procedure Laterality Date  . none      Prior to Admission medications   Medication Sig Start Date End Date Taking? Authorizing Provider  albuterol (PROVENTIL) (2.5 MG/3ML) 0.083% nebulizer solution Take 2.5 mg by nebulization every 4 (four) hours as needed for wheezing or shortness of breath.    Yes [provider]  budesonide (PULMICORT) 0.5 MG/2ML nebulizer solution Take 0.5 mg by nebulization 2 (two) times daily.   Yes [provider]  diazepam (VALIUM) 5 MG tablet Take 5 mg by mouth 3 (three) times daily as needed for anxiety or muscle spasms.   Yes [provider]  ipratropium (ATROVENT) 0.02 % nebulizer solution Take 0.5 mg by nebulization every 4 (four) hours as needed for wheezing or shortness of breath.    Yes [provider]  loratadine  (CLARITIN) 5 MG/5ML syrup Take 5 mg by mouth at bedtime.   Yes [provider]  mupirocin ointment (BACTROBAN) 2 % APPLY 3 TIMES A DAY TO AFFECTED AREA 04/06/18  Yes [provider]  phenytoin (DILANTIN) 125 MG/5ML suspension Take 3.5 mLs (87.5 mg total) by mouth 2 (two) times daily. Please do not take dose for evening of March 6 and morning of March 7 then resume dose 04/07/18  Yes Mody, Sital, MD  polyethylene glycol (MIRALAX / GLYCOLAX) packet Take 17 g by mouth daily as needed for mild constipation or moderate constipation.    Yes [provider]  ranitidine (ZANTAC) 150 MG/10ML syrup Take 112.5 mg by mouth 2 (two) times daily.   Yes [provider]  simethicone (MYLICON) 80 MG chewable tablet Chew 80 mg by mouth as needed for flatulence.   Yes [provider]    Allergies Potassium-containing compounds  Family History  Problem Relation Age of Onset  . Hypertension Mother   . Diabetes Mellitus II Brother     Social History Social History   Tobacco Use  . Smoking status: Never Smoker  . Smokeless tobacco: Never Used  Substance Use Topics  . Alcohol use: No  . Drug use: No    Review of Systems Level 5 caveat: Unable to obtain review of systems due to cerebral palsy  ____________________________________________   PHYSICAL EXAM:  VITAL SIGNS: ED Triage Vitals  Enc Vitals Group     BP 04/13/18 1603 (!) 124/93     Pulse --  Resp 04/13/18 1603 (!) 30     Temp 04/13/18 1603 98 F (36.7 C)     Temp Source 04/13/18 1603 Axillary     SpO2 04/13/18 1603 97 %     Weight 04/13/18 1604 62 lb 9.8 oz (28.4 kg)     Height 04/13/18 1604 5\' 4"  (1.626 m)     Head Circumference --      Peak Flow --      Pain Score --      Pain Loc --      Pain Edu? --      Excl. in [place]? --     Constitutional: Alert, nonverbal.  Uncomfortable appearing but in no acute distress. Eyes: Conjunctivae are normal.  Head: Atraumatic. Nose: No  congestion/rhinnorhea. Mouth/Throat: Mucous membranes are moist.   Neck: Normal range of motion.  Cardiovascular: Tachycardic, regular rhythm. Grossly normal heart sounds.  Good peripheral circulation. Respiratory: Slightly increased respiratory effort.  No retractions. Lungs CTAB. Gastrointestinal: Soft and nontender. No distention.  Genitourinary: No flank tenderness. Musculoskeletal: Extremities warm and well perfused.  Neurologic: Motor intact in all extremities. Skin:  Skin is warm and dry. No rash noted. Psychiatric: Unable to assess  ____________________________________________   LABS (all labs ordered are listed, but only abnormal results are displayed)  Labs Reviewed  COMPREHENSIVE METABOLIC PANEL - Abnormal; Notable for the following components:      Result Value   Sodium 148 (*)    Chloride 115 (*)    Glucose, Bld 101 (*)    All other components within normal limits  LACTIC ACID, PLASMA - Abnormal; Notable for the following components:   Lactic Acid, Venous 2.3 (*)    All other components within normal limits  URINALYSIS, COMPLETE (UACMP) WITH MICROSCOPIC - Abnormal; Notable for the following components:   Color, Urine YELLOW (*)    APPearance CLEAR (*)    Hgb urine dipstick LARGE (*)    Leukocytes,Ua SMALL (*)    All other components within normal limits  CBC WITH DIFFERENTIAL/PLATELET - Abnormal; Notable for the following components:   RBC 3.70 (*)    Hemoglobin 11.6 (*)    MCV 105.9 (*)    MCHC 29.6 (*)    RDW 15.6 (*)    All other components within normal limits  CULTURE, BLOOD (ROUTINE X 2)  CULTURE, BLOOD (ROUTINE X 2)  TROPONIN I  INFLUENZA PANEL BY PCR (TYPE A & B)  LACTIC ACID, PLASMA  CBC WITH DIFFERENTIAL/PLATELET  MAGNESIUM  PHOSPHORUS  PROCALCITONIN  VITAMIN B12  FOLATE  CBG MONITORING, ED   ____________________________________________  EKG  ED ECG REPORT I, Arta Silence, the attending physician, personally viewed and interpreted  this ECG.  Date: 04/13/2018 EKG Time: 1605 Rate: 131 Rhythm: normal sinus rhythm QRS Axis: normal Intervals: normal ST/T Wave abnormalities: nonspecific ST abnormality Narrative Interpretation: no evidence of acute ischemia ____________________________________________  RADIOLOGY  CXR: No focal infiltrate or other acute abnormality  ____________________________________________   PROCEDURES  Procedure(s) performed: No  Procedures  Critical Care performed: Yes  CRITICAL CARE Performed by: Arta Silence   Total critical care time: 30 minutes  Critical care time was exclusive of separately billable procedures and treating other patients.  Critical care was necessary to treat or prevent imminent or life-threatening deterioration.  Critical care was time spent personally by me on the following activities: development of treatment plan with patient and/or surrogate as well as nursing, discussions with consultants, evaluation of patient's response to treatment, examination of patient, obtaining  history from patient or surrogate, ordering and performing treatments and interventions, ordering and review of laboratory studies, ordering and review of radiographic studies, pulse oximetry and re-evaluation of patient's condition. ____________________________________________   INITIAL IMPRESSION / ASSESSMENT AND PLAN / ED COURSE  Pertinent labs & imaging results that were available during my care of the patient were reviewed by me and considered in my medical decision making (see chart for details).  58 year old female with a history of cerebral palsy and other PMH as noted above presents with altered mental status, increased work of breathing, and tactile fever over the last day.  I reviewed the past medical records in [place].  The patient was admitted 1 week ago with aspiration pneumonia and hypoxia and discharged the following day on antibiotics.  On exam the patient is  uncomfortable appearing.  She is tachycardic and slightly tachypneic with increased work of breathing but no acute respiratory distress or hypoxia.  Neuro exam is nonfocal.  The remainder of the exam is as described above.  Overall presentation is most concerning for acute infection/sepsis.  We will give fluids, obtain labs and sepsis work-up, chest x-ray, and reassess.  ----------------------------------------- 8:35 PM on 04/13/2018 -----------------------------------------  Chest x-ray showed no focal infiltrate.  The patient lactate is elevated.  The remainder of her labs are relatively reassuring.  She is still tachycardic after the first liter of saline.  UA shows WBCs and RBCs.  It is possible that the patient has a UTI, so I will start ceftriaxone and give additional fluids.  I signed the patient out to the hospitalist. ____________________________________________   FINAL CLINICAL IMPRESSION(S) / ED DIAGNOSES  Final diagnoses:  Sepsis without acute organ dysfunction, due to unspecified organism [facility])      NEW MEDICATIONS STARTED DURING THIS VISIT:  New Prescriptions   No medications on file     Note:  This document was prepared using Dragon voice recognition software and may include unintentional dictation errors.    Arta Silence, MD 04/13/18 2035

## 2018-04-13 NOTE — ED Triage Notes (Signed)
Pt to ER via EMS from home with c/o "not acting herself".  Pt caregiver states she has had subjective fever and has not been acting normally.  Seen by PMD yesterday and requested further evaluation today if symptoms continued.

## 2018-04-13 NOTE — ED Notes (Signed)
Date and time results received: 04/13/18 4:59 PM   Test: Lactic Critical Value: 2.3  Name of Provider Notified: Cherylann Banas, MD

## 2018-04-13 NOTE — ED Notes (Signed)
.. ED TO INPATIENT HANDOFF REPORT  ED Nurse Name and Phone #: Deneise Lever [phone]  S Name/Age/Gender Roberta Ramos 58 y.o. female Room/Bed: ED16A/ED16A  Code Status   Code Status: Prior  Home/SNF/Other Home {Patient oriented to: other; pt non-verbal at baseline. Pt alert at this time  Is this baseline? Yes   Triage Complete: Triage complete  Chief Complaint alt mental status  Triage Note Pt to ER via EMS from home with c/o "not acting herself".  Pt caregiver states she has had subjective fever and has not been acting normally.  Seen by PMD yesterday and requested further evaluation today if symptoms continued.   Allergies Allergies  Allergen Reactions  . Potassium-Containing Compounds Hives and Swelling    Level of Care/Admitting Diagnosis ED Disposition    ED Disposition Condition [facility] Area: [place] [[id number]]  Level of Care: Telemetry [5]  Diagnosis: Sepsis secondary to UTI [facility]) [[id number]]  Admitting Physician: Arta Silence [[id number]]  Attending Physician: Arta Silence [[id number]]  Estimated length of stay: past midnight tomorrow  Certification:: I certify this patient will need inpatient services for at least 2 midnights  PT Class (Do Not Modify): Inpatient [101]  PT Acc Code (Do Not Modify): Private [1]       B Medical/Surgery History Past Medical History:  Diagnosis Date  . Arthritis   . Cerebral palsy ([place])   . COPD (chronic obstructive pulmonary disease) ([place])   . Seizures ([place])    Past Surgical History:  Procedure Laterality Date  . none       A IV Location/Drains/Wounds Patient Lines/Drains/Airways Status   Active Line/Drains/Airways    Name:   Placement date:   Placement time:   Site:   Days:   Peripheral IV 04/13/18 Right Hand   04/13/18    1649    Hand   less than 1   External Urinary Catheter   04/13/18    1740    -   less than 1   Pressure Injury 04/06/18 Stage II -  Partial thickness  loss of dermis presenting as a shallow open ulcer with a red, pink wound bed without slough.   04/06/18    1804     7          Intake/Output Last 24 hours  Intake/Output Summary (Last 24 hours) at 04/13/2018 2156 Last data filed at 04/13/2018 2156 Gross per 24 hour  Intake 2101.04 ml  Output -  Net 2101.04 ml    Labs/Imaging Results for orders placed or performed during the hospital encounter of 04/13/18 (from the past 48 hour(s))  Comprehensive metabolic panel     Status: Abnormal   Collection Time: 04/13/18  4:18 PM  Result Value Ref Range   Sodium 148 (H) 135 - 145 mmol/L   Potassium 3.8 3.5 - 5.1 mmol/L   Chloride 115 (H) 98 - 111 mmol/L   CO2 23 22 - 32 mmol/L   Glucose, Bld 101 (H) 70 - 99 mg/dL   BUN 10 6 - 20 mg/dL   Creatinine, Ser 0.47 0.44 - 1.00 mg/dL   Calcium 9.2 8.9 - 10.3 mg/dL   Total Protein 8.0 6.5 - 8.1 g/dL   Albumin 4.5 3.5 - 5.0 g/dL   AST 24 15 - 41 U/L   ALT 12 0 - 44 U/L   Alkaline Phosphatase 89 38 - 126 U/L   Total Bilirubin 0.8 0.3 - 1.2 mg/dL   GFR calc non Af  Amer >60 >60 mL/min   GFR calc Af Amer >60 >60 mL/min   Anion gap 10 5 - 15    Comment: Performed at [facility], [place], [place] [address]  Lactic acid, plasma     Status: Abnormal   Collection Time: 04/13/18  4:19 PM  Result Value Ref Range   Lactic Acid, Venous 2.3 (HH) 0.5 - 1.9 mmol/L    Comment: CRITICAL RESULT CALLED TO, READ BACK BY AND VERIFIED WITH JENNIFER WHITLEY AT 6045 ON 04/13/2018 BY MLK.Marland KitchenMarland KitchenLos Angeles Community Hospital Performed at [facility], [address] [place], [place] [address]   Troponin I - Once     Status: None   Collection Time: 04/13/18  4:19 PM  Result Value Ref Range   Troponin I <0.03 <0.03 ng/mL    Comment: Performed at [facility], [address] [place], [place] [address]  Influenza panel by PCR (type A & B)     Status: None   Collection Time: 04/13/18  5:40 PM  Result Value Ref Range   Influenza A By PCR NEGATIVE  NEGATIVE   Influenza B By PCR NEGATIVE NEGATIVE    Comment: (NOTE) The Xpert Xpress Flu assay is intended as an aid in the diagnosis of  influenza and should not be used as a sole basis for treatment.  This  assay is FDA approved for nasopharyngeal swab specimens only. Nasal  washings and aspirates are unacceptable for Xpert Xpress Flu testing. Performed at [facility], [address] [place], [place] [address]   CBC with Differential     Status: Abnormal   Collection Time: 04/13/18  7:19 PM  Result Value Ref Range   WBC 7.2 4.0 - 10.5 K/uL   RBC 3.70 (L) 3.87 - 5.11 MIL/uL   Hemoglobin 11.6 (L) 12.0 - 15.0 g/dL   HCT 39.2 36.0 - 46.0 %   MCV 105.9 (H) 80.0 - 100.0 fL   MCH 31.4 26.0 - 34.0 pg   MCHC 29.6 (L) 30.0 - 36.0 g/dL   RDW 15.6 (H) 11.5 - 15.5 %   Platelets 245 150 - 400 K/uL   nRBC 0.0 0.0 - 0.2 %   Neutrophils Relative % 52 %   Neutro Abs 3.7 1.7 - 7.7 K/uL   Lymphocytes Relative 31 %   Lymphs Abs 2.3 0.7 - 4.0 K/uL   Monocytes Relative 11 %   Monocytes Absolute 0.8 0.1 - 1.0 K/uL   Eosinophils Relative 5 %   Eosinophils Absolute 0.4 0.0 - 0.5 K/uL   Basophils Relative 1 %   Basophils Absolute 0.1 0.0 - 0.1 K/uL   Immature Granulocytes 0 %   Abs Immature Granulocytes 0.02 0.00 - 0.07 K/uL    Comment: Performed at [facility], [address] [place], [place] [address]  Urinalysis, Complete w Microscopic     Status: Abnormal   Collection Time: 04/13/18  7:43 PM  Result Value Ref Range   Color, Urine YELLOW (A) YELLOW   APPearance CLEAR (A) CLEAR   Specific Gravity, Urine 1.020 1.005 - 1.030   pH 6.0 5.0 - 8.0   Glucose, UA NEGATIVE NEGATIVE mg/dL   Hgb urine dipstick LARGE (A) NEGATIVE   Bilirubin Urine NEGATIVE NEGATIVE   Ketones, ur NEGATIVE NEGATIVE mg/dL   Protein, ur NEGATIVE NEGATIVE mg/dL   Nitrite NEGATIVE NEGATIVE   Leukocytes,Ua SMALL (A) NEGATIVE   RBC / HPF 21-50 0 - 5 RBC/hpf   WBC, UA 11-20 0 - 5  WBC/hpf   Bacteria, UA  NONE SEEN NONE SEEN   Squamous Epithelial / LPF 0-5 0 - 5    Comment: Performed at [facility], [address] [place], [place] [address]  Magnesium     Status: None   Collection Time: 04/13/18  8:35 PM  Result Value Ref Range   Magnesium 2.2 1.7 - 2.4 mg/dL    Comment: Performed at [facility], [address] [place], [place] [address]  Phosphorus     Status: None   Collection Time: 04/13/18  8:35 PM  Result Value Ref Range   Phosphorus 2.9 2.5 - 4.6 mg/dL    Comment: Performed at [facility], [address] [place], [place] [address]  Procalcitonin - Baseline     Status: None   Collection Time: 04/13/18  8:35 PM  Result Value Ref Range   Procalcitonin <0.10 ng/mL    Comment:        Interpretation: PCT (Procalcitonin) <= 0.5 ng/mL: Systemic infection (sepsis) is not likely. Local bacterial infection is possible. (NOTE)       Sepsis PCT Algorithm           Lower Respiratory Tract                                      Infection PCT Algorithm    ----------------------------     ----------------------------         PCT < 0.25 ng/mL                PCT < 0.10 ng/mL         Strongly encourage             Strongly discourage   discontinuation of antibiotics    initiation of antibiotics    ----------------------------     -----------------------------       PCT 0.25 - 0.50 ng/mL            PCT 0.10 - 0.25 ng/mL               OR       >80% decrease in PCT            Discourage initiation of                                            antibiotics      Encourage discontinuation           of antibiotics    ----------------------------     -----------------------------         PCT >= 0.50 ng/mL              PCT 0.26 - 0.50 ng/mL               AND        <80% decrease in PCT             Encourage initiation of                                             antibiotics       Encourage continuation  of antibiotics    ----------------------------      -----------------------------        PCT >= 0.50 ng/mL                  PCT > 0.50 ng/mL               AND         increase in PCT                  Strongly encourage                                      initiation of antibiotics    Strongly encourage escalation           of antibiotics                                     -----------------------------                                           PCT <= 0.25 ng/mL                                                 OR                                        > 80% decrease in PCT                                     Discontinue / Do not initiate                                             antibiotics Performed at [facility], [address] [place], [place] [address]   Folate     Status: None   Collection Time: 04/13/18  8:35 PM  Result Value Ref Range   Folate 11.2 >5.9 ng/mL    Comment: Performed at [facility], [address] [place],  [address]   Dg Chest Portable 1 View  Result Date: 04/13/2018 CLINICAL DATA:  Sepsis EXAM: PORTABLE CHEST 1 VIEW COMPARISON:  04/06/2018 FINDINGS: Cardiac shadow is within normal limits. Patient is significantly rotated to the left accentuating the mediastinal markings. No focal infiltrate or effusion is noted. No acute bony abnormality is seen. IMPRESSION: No acute abnormality noted. Electronically Signed   By: Inez Catalina M.D.   On: 04/13/2018 17:01    Pending Labs Unresulted Labs (From admission, onward)    Start     Ordered   04/13/18 2036  Vitamin B12  Add-on,   AD    Question:  Specimen collection method  Answer:  Unit=Unit collect   04/13/18 2035   04/13/18 1913  CBC with Differential  Once,   STAT  04/13/18 1913   04/13/18 1622  Lactic acid, plasma  Now then every 2 hours,   STAT     04/13/18 1621   04/13/18 1622  Culture, blood (routine x 2)  BLOOD CULTURE X 2,   STAT     04/13/18 1621   Signed and Held  Basic metabolic panel  Tomorrow morning,   R     Signed  and Held          Vitals/Pain Today's Vitals   04/13/18 1900 04/13/18 1930 04/13/18 2100 04/13/18 2130  BP: (!) 168/96 (!) 150/92 136/82 129/83  Pulse: (!) 126 (!) 125 (!) 106 (!) 103  Resp: (!) 35 (!) 32  (!) 24  Temp:      TempSrc:      SpO2: 100% 98% 99% 96%  Weight:      Height:        Isolation Precautions Droplet precaution  Medications Medications  sodium chloride flush (NS) 0.9 % injection 3 mL (has no administration in time range)  lactated ringers infusion (has no administration in time range)  cefTRIAXone (ROCEPHIN) 1 g in sodium chloride 0.9 % 100 mL IVPB (has no administration in time range)  morphine 2 MG/ML injection 2 mg (has no administration in time range)    Or  morphine 4 MG/ML injection 4 mg (has no administration in time range)  sodium chloride 0.9 % bolus 1,000 mL (0 mLs Intravenous Stopped 04/13/18 1918)  morphine 4 MG/ML injection 4 mg (4 mg Intravenous Given 04/13/18 1945)  sodium chloride 0.9 % bolus 1,000 mL (0 mLs Intravenous Stopped 04/13/18 2156)  cefTRIAXone (ROCEPHIN) 1 g in sodium chloride 0.9 % 100 mL IVPB (0 g Intravenous Stopped 04/13/18 2156)    Mobility non-ambulatory Low fall risk   Focused Assessments Neuro Assessment Handoff:  Swallow screen pass? No po medications given.  Cardiac Rhythm: Sinus tachycardia       Neuro Assessment:   Neuro Checks:      Last Documented NIHSS Modified Score:   Has TPA been given? No If patient is a Neuro Trauma and patient is going to OR before floor call report to [place] nurse: [phone] or [phone]     R Recommendations: See Admitting Provider Note  Report given to:   Additional Notes:

## 2018-04-13 NOTE — ED Notes (Signed)
Attempted I&O cath for urine without success, PurWick placed for incontinence and possible urine collection.

## 2018-04-13 NOTE — H&P (Signed)
[facility] [place] NAME: Roberta Ramos    MR#:  [id number]  DATE OF BIRTH:  03-08-1960  DATE OF ADMISSION:  04/13/2018  PRIMARY CARE PHYSICIAN: Marguerita Merles, MD   REQUESTING/REFERRING PHYSICIAN: Arta Silence, MD  CHIEF COMPLAINT:   Chief Complaint  Patient presents with  . Altered Mental Status    HISTORY OF PRESENT ILLNESS:  Roberta Ramos  is a 58 y.o. female with a known history of cerebral palsy, epilepsy, COPD (no O2) p/w fever, tachypnea, tachycardia. Pt is non-verbal and non-functional at baseline. Her extremities are atrophic and exhibit contracture. Pt is accompanied by her sister/DPOA, [name], with whom she lives. [name] is reachable at [phone]. I am told that the pt was admitted to [facility] w/ aspiration pneumonia ~1wk ago (04/06/2018 - 04/07/2018 per records). She was subsequently discharged home. Her sister tells me that a home nurse came to see her Wednesday (04/12/2018). VS checked, BP "low", HR "high", "fever". Pt's sister contacted pt's PCP, who recommended ED evaluation. Appears toxic/diaphoretic but is not in distress. (+) tachycardia + tachypnea on admission VS, SIRS (+). Lungs clear. U/A (+), sepsis 2/2 UTI.  PAST MEDICAL HISTORY:   Past Medical History:  Diagnosis Date  . Arthritis   . Cerebral palsy ([place])   . COPD (chronic obstructive pulmonary disease) ([place])   . Seizures ([place])     PAST SURGICAL HISTORY:   Past Surgical History:  Procedure Laterality Date  . none      SOCIAL HISTORY:   Social History   Tobacco Use  . Smoking status: Never Smoker  . Smokeless tobacco: Never Used  Substance Use Topics  . Alcohol use: No    FAMILY HISTORY:   Family History  Problem Relation Age of Onset  . Hypertension Mother   . Diabetes Mellitus II Brother     DRUG ALLERGIES:   Allergies  Allergen Reactions  . Potassium-Containing Compounds Hives and Swelling    REVIEW  OF SYSTEMS:   Review of Systems  Unable to perform ROS: Patient nonverbal  Constitutional: Positive for diaphoresis and fever.   MEDICATIONS AT HOME:   Prior to Admission medications   Medication Sig Start Date End Date Taking? Authorizing Provider  albuterol (PROVENTIL) (2.5 MG/3ML) 0.083% nebulizer solution Take 2.5 mg by nebulization every 4 (four) hours as needed for wheezing or shortness of breath.    Yes [provider]  budesonide (PULMICORT) 0.5 MG/2ML nebulizer solution Take 0.5 mg by nebulization 2 (two) times daily.   Yes [provider]  diazepam (VALIUM) 5 MG tablet Take 5 mg by mouth 3 (three) times daily as needed for anxiety or muscle spasms.   Yes [provider]  ipratropium (ATROVENT) 0.02 % nebulizer solution Take 0.5 mg by nebulization every 4 (four) hours as needed for wheezing or shortness of breath.    Yes [provider]  loratadine (CLARITIN) 5 MG/5ML syrup Take 5 mg by mouth at bedtime.   Yes [provider]  mupirocin ointment (BACTROBAN) 2 % APPLY 3 TIMES A DAY TO AFFECTED AREA 04/06/18  Yes [provider]  phenytoin (DILANTIN) 125 MG/5ML suspension Take 3.5 mLs (87.5 mg total) by mouth 2 (two) times daily. Please do not take dose for evening of March 6 and morning of March 7 then resume dose 04/07/18  Yes Mody, Sital, MD  polyethylene glycol (MIRALAX / GLYCOLAX) packet Take 17 g by mouth daily as needed for  mild constipation or moderate constipation.    Yes [provider]  ranitidine (ZANTAC) 150 MG/10ML syrup Take 112.5 mg by mouth 2 (two) times daily.   Yes [provider]  simethicone (MYLICON) 80 MG chewable tablet Chew 80 mg by mouth as needed for flatulence.   Yes [provider]      VITAL SIGNS:  Blood pressure 129/83, pulse (!) 103, temperature 98 F (36.7 C), temperature source Axillary, resp. rate (!) 24, height 5\' 4"  (1.626 m), weight 28.4 kg, SpO2 96 %.  PHYSICAL  EXAMINATION:  Physical Exam Constitutional:      General: She is not in acute distress.    Appearance: She is ill-appearing, toxic-appearing and diaphoretic.  HENT:     Head: Atraumatic.     Mouth/Throat:     Pharynx: Oropharynx is clear.  Eyes:     General: No scleral icterus.    Extraocular Movements: Extraocular movements intact.     Conjunctiva/sclera: Conjunctivae normal.  Neck:     Musculoskeletal: Neck supple.  Cardiovascular:     Rate and Rhythm: Regular rhythm. Tachycardia present.     Heart sounds: Normal heart sounds. No murmur. No friction rub. No gallop.   Pulmonary:     Effort: Pulmonary effort is normal. No respiratory distress.     Breath sounds: Normal breath sounds. No wheezing, rhonchi or rales.  Abdominal:     General: There is no distension.     Palpations: Abdomen is soft.     Tenderness: There is no abdominal tenderness. There is no guarding or rebound.  Lymphadenopathy:     Cervical: No cervical adenopathy.  Skin:    General: Skin is warm.     Findings: No erythema or rash.  Neurological:     Mental Status: Mental status is at baseline.     Comments: Pt non-verbal/non-functional at baseline. Extremities atrophic, (+) contracture.  Psychiatric:        Speech: She is noncommunicative.     Comments: Non-verbal at baseline.    LABORATORY PANEL:   CBC Recent Labs  Lab 04/13/18 1919  WBC 7.2  HGB 11.6*  HCT 39.2  PLT 245   ------------------------------------------------------------------------------------------------------------------  Chemistries  Recent Labs  Lab 04/13/18 1618 04/13/18 2035  NA 148*  --   K 3.8  --   CL 115*  --   CO2 23  --   GLUCOSE 101*  --   BUN 10  --   CREATININE 0.47  --   CALCIUM 9.2  --   MG  --  2.2  AST 24  --   ALT 12  --   ALKPHOS 89  --   BILITOT 0.8  --    ------------------------------------------------------------------------------------------------------------------  Cardiac Enzymes Recent  Labs  Lab 04/13/18 1619  TROPONINI <0.03   ------------------------------------------------------------------------------------------------------------------  RADIOLOGY:  Dg Chest Portable 1 View  Result Date: 04/13/2018 CLINICAL DATA:  Sepsis EXAM: PORTABLE CHEST 1 VIEW COMPARISON:  04/06/2018 FINDINGS: Cardiac shadow is within normal limits. Patient is significantly rotated to the left accentuating the mediastinal markings. No focal infiltrate or effusion is noted. No acute bony abnormality is seen. IMPRESSION: No acute abnormality noted. Electronically Signed   By: Inez Catalina M.D.   On: 04/13/2018 17:01   IMPRESSION AND PLAN:   A/P: 63F w/ PMHx cerebral palsy, epilepsy, COPD (no O2), non-verbal/non-functional at baseline, p/w fever, tachypnea, tachycardia, SIRS (+), U/A (+), sepsis 2/2 UTI. Dehydration, hypernatremia, hyperchloremia, hyperglycemia, lactate elevation, macrocytic anemia. -Fever, tachypnea, tachycardia, SIRS, UTI,  sepsis, lactate elevation: (+) tachycardia + tachypnea on admission VS, SIRS (+). Lungs clear. CXR (-). Flu (-). U/A (+) Hgb, RBC, WBC, leuk est. Sepsis 2/2 UTI. Lac 2.3. PCT pending. BCx, UCx pending. Ceftriaxone. IVF. -Dehydration, hypernatremia, hyperchloremia: Na+ 148, Cl- 115. Free water deficit, volume contraction, dehydration. IVF. -Macrocytic anemia: Hgb 11.6, MCV 105.9. Likely 2/2 Dilantin. B12, folate pending. -Aspiration precautions. -Seizure precautions. -c/w other home meds/formulary subs as tolerated. -FEN/GI: Dysphagia 1 diet. -DVT PPx: Lovenox. -Code status: Full code. -Disposition: Admission, > 2 midnights. -Philosophy of care: Had a brief discussion w/ pt's sister regarding caregiver burnout. She tells me pt has five sisters, amongst whom care is shared.   All the records are reviewed and case discussed with ED provider. Management plans discussed with the patient, family and they are in agreement.  CODE STATUS: Full code.  TOTAL TIME  TAKING CARE OF THIS PATIENT: 75 minutes.    Arta Silence M.D on 04/13/2018 at 10:15 PM  Between 7am to 6pm - Pager - [phone]  After 6pm go to [email or web address] - Proofreader  [employer]  Office  [phone]  CC: Primary care physician; Marguerita Merles, MD   Note: This dictation was prepared with Dragon dictation along with smaller phrase technology. Any transcriptional errors that result from this process are unintentional.

## 2018-04-14 LAB — RESPIRATORY PANEL BY PCR
Adenovirus: NOT DETECTED
Bordetella pertussis: NOT DETECTED
CORONAVIRUS 229E-RVPPCR: NOT DETECTED
Chlamydophila pneumoniae: NOT DETECTED
Coronavirus HKU1: NOT DETECTED
Coronavirus NL63: NOT DETECTED
Coronavirus OC43: NOT DETECTED
Influenza A: NOT DETECTED
Influenza B: NOT DETECTED
Metapneumovirus: NOT DETECTED
Mycoplasma pneumoniae: NOT DETECTED
Parainfluenza Virus 1: NOT DETECTED
Parainfluenza Virus 2: NOT DETECTED
Parainfluenza Virus 3: NOT DETECTED
Parainfluenza Virus 4: NOT DETECTED
RHINOVIRUS / ENTEROVIRUS - RVPPCR: NOT DETECTED
Respiratory Syncytial Virus: NOT DETECTED

## 2018-04-14 LAB — PHENYTOIN LEVEL, TOTAL: Phenytoin Lvl: 8.3 ug/mL — ABNORMAL LOW (ref 10.0–20.0)

## 2018-04-14 LAB — VITAMIN B12: VITAMIN B 12: 199 pg/mL (ref 180–914)

## 2018-04-14 LAB — BASIC METABOLIC PANEL
Anion gap: 5 (ref 5–15)
BUN: 5 mg/dL — ABNORMAL LOW (ref 6–20)
CO2: 20 mmol/L — ABNORMAL LOW (ref 22–32)
Calcium: 7.9 mg/dL — ABNORMAL LOW (ref 8.9–10.3)
Chloride: 118 mmol/L — ABNORMAL HIGH (ref 98–111)
Creatinine, Ser: <0.3 mg/dL — ABNORMAL LOW (ref 0.44–1.00)
Glucose, Bld: 120 mg/dL — ABNORMAL HIGH (ref 70–99)
Potassium: 3.1 mmol/L — ABNORMAL LOW (ref 3.5–5.1)
Sodium: 143 mmol/L (ref 135–145)

## 2018-04-14 LAB — LACTIC ACID, PLASMA: Lactic Acid, Venous: 1.3 mmol/L (ref 0.5–1.9)

## 2018-04-14 MED ORDER — SODIUM CHLORIDE 0.9 % IV SOLN
INTRAVENOUS | Status: DC | PRN
  Administered 2018-04-14 – 2018-04-16 (×2): 500 mL via INTRAVENOUS

## 2018-04-14 MED ORDER — POLYETHYLENE GLYCOL 3350 17 G PO PACK: 17.0000 g | PACK | Freq: Every day | ORAL | Status: DC | PRN

## 2018-04-14 MED ORDER — SODIUM CHLORIDE 0.9 % IV SOLN
100.0000 mg | Freq: Two times a day (BID) | INTRAVENOUS | Status: DC
  Administered 2018-04-14 – 2018-04-16 (×4): 100 mg via INTRAVENOUS
  Filled 2018-04-14 (×5): qty 100

## 2018-04-14 MED ORDER — ADULT MULTIVITAMIN W/MINERALS CH: 1.0000 | ORAL_TABLET | Freq: Every day | ORAL | Status: DC

## 2018-04-14 MED ORDER — ENSURE ENLIVE PO LIQD
237.0000 mL | Freq: Three times a day (TID) | ORAL | Status: DC
  Administered 2018-04-14 – 2018-04-15 (×4): 237 mL via ORAL

## 2018-04-14 MED ORDER — METOPROLOL TARTRATE 25 MG PO TABS
25.0000 mg | ORAL_TABLET | Freq: Three times a day (TID) | ORAL | Status: DC
  Administered 2018-04-14 – 2018-04-16 (×4): 25 mg via ORAL
  Filled 2018-04-14 (×5): qty 1

## 2018-04-14 MED ORDER — VITAMIN C 500 MG PO TABS
250.0000 mg | ORAL_TABLET | Freq: Two times a day (BID) | ORAL | Status: DC
  Administered 2018-04-14 – 2018-04-16 (×4): 250 mg via ORAL
  Filled 2018-04-14 (×4): qty 1

## 2018-04-14 MED ORDER — TRAMADOL HCL 50 MG PO TABS: 50.0000 mg | ORAL_TABLET | Freq: Four times a day (QID) | ORAL | Status: DC | PRN

## 2018-04-14 MED ORDER — ADULT MULTIVITAMIN LIQUID CH
15.0000 mL | Freq: Every day | ORAL | Status: DC
  Administered 2018-04-15: 15 mL via ORAL
  Filled 2018-04-14 (×2): qty 15

## 2018-04-14 NOTE — Progress Notes (Signed)
CODE SEPSIS - PHARMACY COMMUNICATION  **Broad Spectrum Antibiotics should be administered within 1 hour of Sepsis diagnosis**  Time Code Sepsis Called/Page Received: n/a  Antibiotics Ordered: ceftriaxone  Time of 1st antibiotic administration: 2027  Additional action taken by pharmacy:   If necessary, Name of Provider/Nurse Contacted:     Tobie Lords ,PharmD Clinical Pharmacist  04/14/2018  12:06 AM

## 2018-04-14 NOTE — Progress Notes (Signed)
Initial Nutrition Assessment  DOCUMENTATION CODES:   Severe malnutrition in context of chronic illness  INTERVENTION:   Ensure Enlive po TID, each supplement provides 350 kcal and 20 grams of protein  Magic cup TID with meals, each supplement provides 290 kcal and 9 grams of protein  Vital Cuisine TID, each supplement provides 520kcal and 22g of protein.   MVI daily   Vitamin C 250mg  po BID  NUTRITION DIAGNOSIS:   Severe Malnutrition related to chronic illness(Cerebral Palsy, COPD) as evidenced by severe fat depletion, severe muscle depletion.  GOAL:   Patient will meet greater than or equal to 90% of their needs  MONITOR:   PO intake, Supplement acceptance, Labs, Weight trends, Skin, I & O's  REASON FOR ASSESSMENT:   Other (Comment)(low BMI)    ASSESSMENT:   64F w/ PMHx cerebral palsy, epilepsy, COPD (no O2), non-verbal/non-functional at baseline, p/w fever, tachypnea, tachycardia, SIRS (+), U/A (+), sepsis 2/2 UTI.    RD familiar with this pt from previous admits. Pt with fair appetite and oral intake at baseline. Pt eats a pureed diet at baseline and apparently pt eats better at home than while here as she does not like the hospital food. Pt has drank Ensure in the past but at times family has reported that pt is tired of Ensure. Pt is contracted. Per chart, pt is weight stable pta. RD will add supplements and MVI to help pt meet her estimated needs and support wound healing. Suspect pt at mild to moderate refeed risk. Pt was eating 100% of small meals prior to her last discharge on 3/6. B12 lab returned low normal; would recommend initiation of supplementation.       Medications reviewed and include: lovenox, ceftriaxone  Labs reviewed: K 3.1(L), BUN 5(L), creat <0.3(L) P 2.9 wnl, Mg 2.2 wnl B12- 199- 3/12  NUTRITION - FOCUSED PHYSICAL EXAM:    Most Recent Value  Orbital Region  Severe depletion  Upper Arm Region  Severe depletion  Thoracic and Lumbar Region   Severe depletion  Buccal Region  Severe depletion  Temple Region  Severe depletion  Clavicle Bone Region  Severe depletion  Clavicle and Acromion Bone Region  Severe depletion  Scapular Bone Region  Severe depletion  Dorsal Hand  Severe depletion  Patellar Region  Severe depletion  Anterior Thigh Region  Severe depletion  Posterior Calf Region  Severe depletion  Edema (RD Assessment)  None  Hair  Reviewed  Eyes  Reviewed  Mouth  Reviewed  Skin  Reviewed  Nails  Reviewed     Diet Order:   Diet Order            DIET - DYS 1 Room service appropriate? Yes; Fluid consistency: Thin  Diet effective now             EDUCATION NEEDS:   No education needs have been identified at this time  Skin:  Skin Assessment: Reviewed RN Assessment(Stage II back)  Last BM:  3/12  Height:   Ht Readings from Last 1 Encounters:  04/13/18 5\' 4"  (1.626 m)    Weight:   Wt Readings from Last 1 Encounters:  04/13/18 26.8 kg    Ideal Body Weight:  54.5 kg  BMI:  Body mass index is 10.13 kg/m.  Estimated Nutritional Needs:   Kcal:  1100-1300kcal/day   Protein:  48-54g/day   Fluid:  883ml/day   Koleen Distance MS, RD, LDN Pager #- [phone] Office#- [phone] After Hours Pager: [phone]

## 2018-04-14 NOTE — Progress Notes (Signed)
[facility] at [place] NAME: Roberta Ramos    MR#:  [id number]  DATE OF BIRTH:  Apr 28, 1960  SUBJECTIVE:  CHIEF COMPLAINT:   Chief Complaint  Patient presents with  . Altered Mental Status   -Patient is nonverbal, sister at bedside.  Grimacing at times due to discomfort on moving. Remains alert.  REVIEW OF SYSTEMS:  Review of Systems  Unable to perform ROS: Patient nonverbal    DRUG ALLERGIES:   Allergies  Allergen Reactions  . Potassium-Containing Compounds Hives and Swelling  . Dairy Aid [Lactase] Other (See Comments)    Stomach pains and constipation    VITALS:  Blood pressure 134/87, pulse 99, temperature 98.6 F (37 C), temperature source Oral, resp. rate 16, height 5\' 4"  (1.626 m), weight 26.8 kg, SpO2 100 %.  PHYSICAL EXAMINATION:  Physical Exam   GENERAL:  58 y.o.-year-old patient lying in the bed, making eye contact but nonverbal.  EYES: Pupils equal, round, reactive to light and accommodation. No scleral icterus. Extraocular muscles intact.  HEENT: Head atraumatic, normocephalic. Oropharynx and nasopharynx clear.  NECK:  Supple, no jugular venous distention. No thyroid enlargement, no tenderness.  LUNGS: Normal breath sounds bilaterally, no wheezing, rales or crepitation. No use of accessory muscles of respiration.  Decreased bibasilar breath sounds.  Occasional rhonchi heard at the bases CARDIOVASCULAR: S1, S2 normal. No murmurs, rubs, or gallops.  ABDOMEN: Soft, nontender, nondistended. Bowel sounds present. No organomegaly or mass.  EXTREMITIES: Contracted, atrophic lower extremities.Marland Kitchen  NEUROLOGIC: Bedbound since 70 months old, completely dependent for all activities.  Atrophic extremities, not following commands.  Making eye contact.  Nonverbal.. Gait not checked.  PSYCHIATRIC: The patient is alert  SKIN: No obvious rash, lesion, or ulcer.    LABORATORY PANEL:   CBC Recent Labs  Lab 04/13/18 1919  WBC 7.2   HGB 11.6*  HCT 39.2  PLT 245   ------------------------------------------------------------------------------------------------------------------  Chemistries  Recent Labs  Lab 04/13/18 1618 04/13/18 2035 04/14/18 0509  NA 148*  --  143  K 3.8  --  3.1*  CL 115*  --  118*  CO2 23  --  20*  GLUCOSE 101*  --  120*  BUN 10  --  5*  CREATININE 0.47  --  <0.30*  CALCIUM 9.2  --  7.9*  MG  --  2.2  --   AST 24  --   --   ALT 12  --   --   ALKPHOS 89  --   --   BILITOT 0.8  --   --    ------------------------------------------------------------------------------------------------------------------  Cardiac Enzymes Recent Labs  Lab 04/13/18 1619  TROPONINI <0.03   ------------------------------------------------------------------------------------------------------------------  RADIOLOGY:  Dg Chest Portable 1 View  Result Date: 04/13/2018 CLINICAL DATA:  Sepsis EXAM: PORTABLE CHEST 1 VIEW COMPARISON:  04/06/2018 FINDINGS: Cardiac shadow is within normal limits. Patient is significantly rotated to the left accentuating the mediastinal markings. No focal infiltrate or effusion is noted. No acute bony abnormality is seen. IMPRESSION: No acute abnormality noted. Electronically Signed   By: Inez Catalina M.D.   On: 04/13/2018 17:01    EKG:   Orders placed or performed during the hospital encounter of 04/13/18  . EKG 12-Lead  . EKG 12-Lead    ASSESSMENT AND PLAN:   58 year old female with cerebral palsy, epilepsy who is bedbound at baseline, completely dependent, nonverbal who was recently discharged from the hospital after being treated for pneumonia is brought back in  secondary to fevers, diaphoresis and tachypnea.  1.  Upper respiratory tract infection-rule out sepsis.  Normal white count, negative procalcitonin. -No urine analysis as urine is clear. -Patient has tachypnea, increased respiratory secretions.  Check respiratory virus panel.  Flu is negative -Chest x-ray is  clear.  Changed antibiotics to doxycycline. -Nebulizer treatments as needed.  2.  Hypokalemia-being replaced  3.  Sinus tachycardia and hypotension-likely secondary to pain as patient grimacing when moving in bed.  Continue pain medications and monitor.  4.  Seizure disorder-seizure precautions.  Continue home medications.  Patient on phenytoin.  Levels have been ordered  5.  DVT prophylaxis-Lovenox  Sister updated at bedside.  Anticipate discharge tomorrow if cultures are negative   All the records are reviewed and case discussed with Care Management/Social Workerr. Management plans discussed with the patient, family and they are in agreement.  CODE STATUS: Full code  TOTAL TIME TAKING CARE OF THIS PATIENT: 58 minutes.   POSSIBLE D/C tomorrow, DEPENDING ON CLINICAL CONDITION.   Gladstone Lighter M.D on 04/14/2018 at 1:48 PM  Between 7am to 6pm - Pager - [phone]  After 6pm go to [email or web address] - password [email or web address] [facility]  Office  [phone]  CC: Primary care physician; Marguerita Merles, MD

## 2018-04-15 LAB — CBC
HCT: 38.8 % (ref 36.0–46.0)
Hemoglobin: 12.6 g/dL (ref 12.0–15.0)
MCH: 30.7 pg (ref 26.0–34.0)
MCHC: 32.5 g/dL (ref 30.0–36.0)
MCV: 94.4 fL (ref 80.0–100.0)
Platelets: 275 10*3/uL (ref 150–400)
RBC: 4.11 MIL/uL (ref 3.87–5.11)
RDW: 15.4 % (ref 11.5–15.5)
WBC: 5.7 10*3/uL (ref 4.0–10.5)
nRBC: 0 % (ref 0.0–0.2)

## 2018-04-15 LAB — BASIC METABOLIC PANEL
Anion gap: 9 (ref 5–15)
Anion gap: 12 (ref 5–15)
BUN: 11 mg/dL (ref 6–20)
BUN: 14 mg/dL (ref 6–20)
CALCIUM: 8.4 mg/dL — AB (ref 8.9–10.3)
CO2: 21 mmol/L — ABNORMAL LOW (ref 22–32)
CO2: 18 mmol/L — ABNORMAL LOW (ref 22–32)
Calcium: 8.8 mg/dL — ABNORMAL LOW (ref 8.9–10.3)
Chloride: 116 mmol/L — ABNORMAL HIGH (ref 98–111)
Chloride: 116 mmol/L — ABNORMAL HIGH (ref 98–111)
Creatinine, Ser: 0.48 mg/dL (ref 0.44–1.00)
Creatinine, Ser: 0.6 mg/dL (ref 0.44–1.00)
GFR calc Af Amer: >60 mL/min (ref >60)
GFR calc Af Amer: >60 mL/min (ref >60)
GFR calc non Af Amer: >60 mL/min (ref >60)
GFR calc non Af Amer: >60 mL/min (ref >60)
Glucose, Bld: 98 mg/dL (ref 70–99)
Glucose, Bld: 98 mg/dL (ref 70–99)
Potassium: 2.8 mmol/L — ABNORMAL LOW (ref 3.5–5.1)
Potassium: 3.8 mmol/L (ref 3.5–5.1)
Sodium: 146 mmol/L — ABNORMAL HIGH (ref 135–145)
Sodium: 146 mmol/L — ABNORMAL HIGH (ref 135–145)

## 2018-04-15 MED ORDER — POTASSIUM CHLORIDE 20 MEQ PO PACK
40.0000 meq | PACK | Freq: Once | ORAL | Status: AC
  Administered 2018-04-15: 40 meq via ORAL
  Filled 2018-04-15: qty 2

## 2018-04-15 MED ORDER — SPIRONOLACTONE 25 MG PO TABS
12.5000 mg | ORAL_TABLET | Freq: Every day | ORAL | Status: DC
  Filled 2018-04-15: qty 0.5

## 2018-04-15 MED ORDER — METOPROLOL TARTRATE 5 MG/5ML IV SOLN
5.0000 mg | INTRAVENOUS | Status: DC | PRN
  Administered 2018-04-15 (×2): 5 mg via INTRAVENOUS
  Filled 2018-04-15 (×2): qty 5

## 2018-04-15 NOTE — Progress Notes (Signed)
Heart rate prior to pushing 5mg  iv metoprolol was 139. Post metoprolol push current heart rate is 99 resp 17. Will continue to monitor

## 2018-04-15 NOTE — Progress Notes (Signed)
Spoke with dr Manuella Ghazi to make aware scheduled po metoprolol was held this am due to sbp <110. Current heart rate 139, bp 145/94. Po medication are difficult to get in at times. Per md order metoprolol 5mg  iv q4h prn for heart rate greater that 120.

## 2018-04-15 NOTE — Progress Notes (Signed)
Text page dr. Manuella Ghazi to make aware potassium is 2.8. will continue to monitor

## 2018-04-15 NOTE — Progress Notes (Signed)
md returned paged, stated pharmacy will address low potassium. Will continue to monitor

## 2018-04-15 NOTE — Progress Notes (Signed)
[place] at [place] NAME: Roberta Ramos    MR#:  [id number]  DATE OF BIRTH:  [birth year]  SUBJECTIVE:  CHIEF COMPLAINT:   Chief Complaint  Patient presents with  . Altered Mental Status   Potassium to 2.8 this morning, pharmacy consulted as she has a severe allergy to potassium compounds. Low dose spironolactone started. No other overnight events. Later on discussion with sister who is primary caretaker - this reaction is only to IV potassium, burning and swelling. Has never been an issue with PO. Spironolactone d/c, PO supplementation ordered.   No other overnight events. Patient nonverbal. Sister concerned about infection at lesion present at L shoulder x 1 month.  REVIEW OF SYSTEMS:  ROS Unable to assess as patient is nonverbal DRUG ALLERGIES:   Allergies  Allergen Reactions  . Potassium-Containing Compounds Hives and Swelling    With IV products only, reviewed with family 04/15/18  . Dairy Aid [Lactase] Other (See Comments)    Stomach pains and constipation   VITALS:  Blood pressure (!) 145/94, pulse (!) 131, temperature 98.3 F (36.8 C), temperature source Oral, resp. rate 17, height 5\' 4"  (1.626 m), weight 26.8 kg, SpO2 95 %. PHYSICAL EXAMINATION:  Physical Exam  GENERAL:  58 y.o.-year-old patient lying in the bed, making eye contact but nonverbal. Winces when moved.  EYES: Pupils equal, round, reactive to light and accommodation. No scleral icterus. Extraocular muscles intact.  HEENT: Head atraumatic, normocephalic. Oropharynx and nasopharynx clear.  NECK:  Supple, no jugular venous distention. No thyroid enlargement, no tenderness.  LUNGS: Normal breath sounds bilaterally, no wheezing, rales or crepitation. No use of accessory muscles of respiration.  Decreased bibasilar breath sounds. Occasional rhonchi heard at the bases CARDIOVASCULAR: S1, S2 normal. No murmurs, rubs, or gallops.  ABDOMEN: Soft, nontender, nondistended.  Bowel sounds present. No organomegaly or mass.  EXTREMITIES: Contracted, atrophic lower extremities.Marland Kitchen  NEUROLOGIC: Bedbound since 58 months old, completely dependent for all activities. Atrophic extremities, not following commands.  Making eye contact.  Nonverbal. Nonambulatory. PSYCHIATRIC: The patient is alert  SKIN:   L shoulder, x 1 month. Reaction to IV potassium per sister.   LABORATORY PANEL:  Female CBC Recent Labs  Lab 04/15/18 0528  WBC 5.7  HGB 12.6  HCT 38.8  PLT 275   ------------------------------------------------------------------------------------------------------------------ Chemistries  Recent Labs  Lab 04/13/18 1618 04/13/18 2035  04/15/18 1334  NA 148*  --    < > 146*  K 3.8  --    < > 3.8  CL 115*  --    < > 116*  CO2 23  --    < > 18*  GLUCOSE 101*  --    < > 98  BUN 10  --    < > 14  CREATININE 0.47  --    < > 0.60  CALCIUM 9.2  --    < > 8.8*  MG  --  2.2  --   --   AST 24  --   --   --   ALT 12  --   --   --   ALKPHOS 89  --   --   --   BILITOT 0.8  --   --   --    < > = values in this interval not displayed.   RADIOLOGY:  No results found. ASSESSMENT AND PLAN:   58 year old female with cerebral palsy, epilepsy who is bedbound at baseline, completely dependent, nonverbal who  was recently discharged from the hospital after being treated for pneumonia is brought back in secondary to fevers, diaphoresis and tachypnea.  1.  Upper respiratory tract infection-rule out sepsis.  Normal white count, negative procalcitonin. -No urine analysis as urine is clear. -Patient has tachypnea, increased respiratory secretions.  Check respiratory virus panel - negative.  Flu is negative -Chest x-ray is clear.  Changed antibiotics to doxycycline. -Nebulizer treatments as needed.  2.  Hypokalemia- being replaced. Oral potassium is tolerated. Recheck shows WNL. Recheck morning labs.   3.  Sinus tachycardia and hypotension - likely secondary to pain as  patient grimacing when moving in bed.  Continue pain medications and monitor.  4.  Seizure disorder-seizure precautions.  Continue home medications.  Patient on phenytoin.  Levels have been ordered. Recheck tomorrow.  5.  DVT prophylaxis-Lovenox  6. L shoulder lesion Per Dr. Manuella Ghazi no sign of local infection, this is a healing wound from IV induced phlebitis. Sister reassured. Continue bandage application, keep weight off.   Sister updated at bedside.  Anticipate discharge tomorrow - blood cultures no growth x 2 days. Monitor for electrolytes and phenytoin level.    All the records are reviewed and case is discussed with Care Management/Social Worker. Management plans discussed with the patient and/or family and they are in agreement.  CODE STATUS: Full Code  TOTAL TIME TAKING CARE OF THIS PATIENT: 30 minutes.   More than 50% of the time was spent in counseling/coordination of care: YES  POSSIBLE D/C IN 1 DAYS, DEPENDING ON CLINICAL CONDITION.   Brit Carbonell PA-C on 04/15/2018 at 2:44 PM  Between 7am to 6pm - Pager - [phone]  After 6 pm go to [email or web address] - Proofreader  [facility]  Office  [phone]  CC: Primary care physician; Marguerita Merles, MD  Note: This dictation was prepared with Dragon dictation along with smaller phrase technology. Any transcriptional errors that result from this process are unintentional.

## 2018-04-16 LAB — BASIC METABOLIC PANEL
Anion gap: 7 (ref 5–15)
BUN: 19 mg/dL (ref 6–20)
CO2: 19 mmol/L — ABNORMAL LOW (ref 22–32)
Calcium: 8.3 mg/dL — ABNORMAL LOW (ref 8.9–10.3)
Chloride: 121 mmol/L — ABNORMAL HIGH (ref 98–111)
Creatinine, Ser: 0.42 mg/dL — ABNORMAL LOW (ref 0.44–1.00)
GFR calc Af Amer: >60 mL/min (ref >60)
GLUCOSE: 114 mg/dL — AB (ref 70–99)
Potassium: 3.1 mmol/L — ABNORMAL LOW (ref 3.5–5.1)
Sodium: 147 mmol/L — ABNORMAL HIGH (ref 135–145)

## 2018-04-16 MED ORDER — POTASSIUM CHLORIDE 20 MEQ PO PACK
40.0000 meq | PACK | Freq: Once | ORAL | Status: AC
  Administered 2018-04-16: 40 meq via ORAL
  Filled 2018-04-16: qty 2

## 2018-04-16 NOTE — Progress Notes (Signed)
Laurin Coder Depolo to be D/C'd Home per MD order.  Discussed  follow up appointments with the patients daughter. No prescriptions given to patient, medication list explained in detail. Patients daughter verbalized understanding.  Allergies as of 04/16/2018      Reactions   Potassium-containing Compounds Hives, Swelling   With IV products only, reviewed with family 04/15/18   Dairy Aid [lactase] Other (See Comments)   Stomach pains and constipation      Medication List    TAKE these medications   albuterol (2.5 MG/3ML) 0.083% nebulizer solution Commonly known as:  PROVENTIL Take 2.5 mg by nebulization every 4 (four) hours as needed for wheezing or shortness of breath.   budesonide 0.5 MG/2ML nebulizer solution Commonly known as:  PULMICORT Take 0.5 mg by nebulization 2 (two) times daily.   diazepam 5 MG tablet Commonly known as:  VALIUM Take 5 mg by mouth 3 (three) times daily as needed for anxiety or muscle spasms.   ipratropium 0.02 % nebulizer solution Commonly known as:  ATROVENT Take 0.5 mg by nebulization every 4 (four) hours as needed for wheezing or shortness of breath.   loratadine 5 MG/5ML syrup Commonly known as:  CLARITIN Take 5 mg by mouth at bedtime.   mupirocin ointment 2 % Commonly known as:  BACTROBAN APPLY 3 TIMES A DAY TO AFFECTED AREA   phenytoin 125 MG/5ML suspension Commonly known as:  DILANTIN Take 3.5 mLs (87.5 mg total) by mouth 2 (two) times daily. Please do not take dose for evening of March 6 and morning of March 7 then resume dose   polyethylene glycol packet Commonly known as:  MIRALAX / GLYCOLAX Take 17 g by mouth daily as needed for mild constipation or moderate constipation.   ranitidine 150 MG/10ML syrup Commonly known as:  ZANTAC Take 112.5 mg by mouth 2 (two) times daily.   simethicone 80 MG chewable tablet Commonly known as:  MYLICON Chew 80 mg by mouth as needed for flatulence.       Vitals:   04/16/18 0715 04/16/18 0840  BP:   121/90  Pulse:  89  Resp:  18  Temp:  98.7 F (37.1 C)  SpO2: 100% 99%    Skin clean, dry and intact without evidence of skin break down, no evidence of skin tears noted. IV catheter discontinued intact. Site without signs and symptoms of complications. Dressing and pressure applied. No distress noted. No complaints noted.  An After Visit Summary was printed and given to the patients daughter Waiting on ride to be discharge home via private vehicle [place]

## 2018-04-16 NOTE — Discharge Summary (Signed)
[facility] at [place] NAME: Roberta Ramos    MR#:  [id number]  DATE OF BIRTH:  Jun 22, 1960  DATE OF ADMISSION:  04/13/2018   ADMITTING PHYSICIAN: Arta Silence, MD  DATE OF DISCHARGE: 04/16/2018 12:15 PM  PRIMARY CARE PHYSICIAN: Marguerita Merles, MD   ADMISSION DIAGNOSIS:  Sepsis without acute organ dysfunction, due to unspecified organism ([place]) [A41.9] DISCHARGE DIAGNOSIS:  Active Problems:   Sepsis secondary to UTI [facility])  SECONDARY DIAGNOSIS:   Past Medical History:  Diagnosis Date   Arthritis    Cerebral palsy ([place])    COPD (chronic obstructive pulmonary disease) ([place])    Seizures ([place])    HOSPITAL COURSE:  58 year old female with cerebral palsy, epilepsy who is bedbound at baseline, completely dependent, nonverbal who was recently discharged from the hospital after being treated for pneumonia is brought back in secondary to fevers, diaphoresis and tachypnea.  1. Upper respiratory tract infection-ruled out sepsis. Normal white count, negative procalcitonin. -No urine analysis as urine is clear. -Patient has tachypnea, increased respiratory secretions. respiratory virus panel - negative. Flu is negative -Chest x-ray is clear.   2. Hypokalemia- repleted  3. Sinus tachycardia and hypotension - likely secondary to pain as patient grimacing when moving in bed. Continue pain medications and monitor.  4. Seizure disorder-seizure precautions. Continue phenytoin.  5. L shoulder lesion no sign of local infection, this is a healing wound from IV potassium phlebitis. Sister reassured. Continue bandage application, keep weight off.   She has very poor prognosis. High risk of recurrent infections, aspirations. Has had Multiple readmissions and will be same till family accepts the reality. She is acceptable for Hospice. After long d/w family they're receptive for Palliative care to follow. My hope is they consider  Hospice and keep her comfortable at home rather than readmissions. DISCHARGE CONDITIONS:  FAIR CONSULTS OBTAINED:  Treatment Team:  Arta Silence, MD DRUG ALLERGIES:   Allergies  Allergen Reactions   Potassium-Containing Compounds Hives and Swelling    With IV products only, reviewed with family 04/15/18   Dairy Aid [Lactase] Other (See Comments)    Stomach pains and constipation   DISCHARGE MEDICATIONS:   Allergies as of 04/16/2018      Reactions   Potassium-containing Compounds Hives, Swelling   With IV products only, reviewed with family 04/15/18   Dairy Aid [lactase] Other (See Comments)   Stomach pains and constipation      Medication List    TAKE these medications   albuterol (2.5 MG/3ML) 0.083% nebulizer solution Commonly known as:  PROVENTIL Take 2.5 mg by nebulization every 4 (four) hours as needed for wheezing or shortness of breath.   budesonide 0.5 MG/2ML nebulizer solution Commonly known as:  PULMICORT Take 0.5 mg by nebulization 2 (two) times daily.   diazepam 5 MG tablet Commonly known as:  VALIUM Take 5 mg by mouth 3 (three) times daily as needed for anxiety or muscle spasms.   ipratropium 0.02 % nebulizer solution Commonly known as:  ATROVENT Take 0.5 mg by nebulization every 4 (four) hours as needed for wheezing or shortness of breath.   loratadine 5 MG/5ML syrup Commonly known as:  CLARITIN Take 5 mg by mouth at bedtime.   mupirocin ointment 2 % Commonly known as:  BACTROBAN APPLY 3 TIMES A DAY TO AFFECTED AREA   phenytoin 125 MG/5ML suspension Commonly known as:  DILANTIN Take 3.5 mLs (87.5 mg total) by mouth 2 (two) times daily. Please do not  take dose for evening of March 6 and morning of March 7 then resume dose   polyethylene glycol packet Commonly known as:  MIRALAX / GLYCOLAX Take 17 g by mouth daily as needed for mild constipation or moderate constipation.   ranitidine 150 MG/10ML syrup Commonly known as:  ZANTAC Take 112.5  mg by mouth 2 (two) times daily.   simethicone 80 MG chewable tablet Commonly known as:  MYLICON Chew 80 mg by mouth as needed for flatulence.        DISCHARGE INSTRUCTIONS:   DIET:  Cardiac diet DISCHARGE CONDITION:  fair ACTIVITY:  Activity as tolerated OXYGEN:  Home Oxygen: No.  Oxygen Delivery: room air DISCHARGE LOCATION:  home with home health, RN (2 week protocol) - followed by [facility]. Recommend Palliative care to follow at home and consider transition to Hospice.  If you experience worsening of your admission symptoms, develop shortness of breath, life threatening emergency, suicidal or homicidal thoughts you must seek medical attention immediately by calling 911 or calling your MD immediately  if symptoms less severe.  You Must read complete instructions/literature along with all the possible adverse reactions/side effects for all the Medicines you take and that have been prescribed to you. Take any new Medicines after you have completely understood and accpet all the possible adverse reactions/side effects.   Please note  You were cared for by a hospitalist during your hospital stay. If you have any questions about your discharge medications or the care you received while you were in the hospital after you are discharged, you can call the unit and asked to speak with the hospitalist on call if the hospitalist that took care of you is not available. Once you are discharged, your primary care physician will handle any further medical issues. Please note that NO REFILLS for any discharge medications will be authorized once you are discharged, as it is imperative that you return to your primary care physician (or establish a relationship with a primary care physician if you do not have one) for your aftercare needs so that they can reassess your need for medications and monitor your lab values.    On the day of Discharge:  VITAL SIGNS:  Blood pressure 121/90, pulse 89,  temperature 98.7 F (37.1 C), temperature source Oral, resp. rate 18, height 5\' 4"  (1.626 m), weight 26.8 kg, SpO2 99 %. PHYSICAL EXAMINATION:  GENERAL: 58 y.o.-year-old patient lying in the bed, making eye contact but nonverbal. Winces when moved.  EYES: Pupils equal, round, reactive to light and accommodation. No scleral icterus. Extraocular muscles intact.  HEENT: Head atraumatic, normocephalic. Oropharynx and nasopharynx clear.  NECK: Supple, no jugular venous distention. No thyroid enlargement, no tenderness.  LUNGS: Normal breath sounds bilaterally, no wheezing, rales or crepitation. No use of accessory muscles of respiration.Decreased bibasilar breath sounds. Occasional rhonchi heard at the bases CARDIOVASCULAR: S1, S2 normal. No murmurs, rubs, or gallops.  ABDOMEN: Soft, nontender, nondistended. Bowel sounds present. No organomegaly or mass.  EXTREMITIES:Contracted, atrophic lower extremities.Marland Kitchen  NEUROLOGIC:Bedbound since 25 months old, completely dependent for all activities. Atrophic extremities, not following commands. Making eye contact. Nonverbal. Nonambulatory. PSYCHIATRIC: The patient is alert  SKIN:   L shoulder, x 1 month. Reaction to IV potassium per sister.  DATA REVIEW:   CBC Recent Labs  Lab 04/15/18 0528  WBC 5.7  HGB 12.6  HCT 38.8  PLT 275    Chemistries  Recent Labs  Lab 04/13/18 1618 04/13/18 2035  04/16/18 0414  NA 148*  --    < >  147*  K 3.8  --    < > 3.1*  CL 115*  --    < > 121*  CO2 23  --    < > 19*  GLUCOSE 101*  --    < > 114*  BUN 10  --    < > 19  CREATININE 0.47  --    < > 0.42*  CALCIUM 9.2  --    < > 8.3*  MG  --  2.2  --   --   AST 24  --   --   --   ALT 12  --   --   --   ALKPHOS 89  --   --   --   BILITOT 0.8  --   --   --    < > = values in this interval not displayed.     Follow-up Information    Marguerita Merles, MD. Schedule an appointment as soon as possible for a visit in 5 day(s).   Specialty:  Family  Medicine Contact information: [place] [address] [phone]         She has very poor prognosis. High risk of recurrent infections, aspirations. Has had Multiple readmissions and will be same till family accepts the reality. She is acceptable for Hospice. After long d/w family they're receptive for Palliative care to follow. My hope is they consider Hospice and keep her comfortable at home rather than readmissions.   Management plans discussed with the patient, family and they are in agreement.  CODE STATUS: Full Code   TOTAL TIME TAKING CARE OF THIS PATIENT: 45 minutes.    Max Sane M.D on 04/16/2018 at 3:18 PM  Between 7am to 6pm - Pager - [phone]  After 6pm go to [email or web address] - Proofreader  [facility]  Office  [phone]  CC: Primary care physician; Marguerita Merles, MD   Note: This dictation was prepared with Dragon dictation along with smaller phrase technology. Any transcriptional errors that result from this process are unintentional.

## 2018-04-16 NOTE — TOC Transition Note (Signed)
Transition of Care [facility]) - CM/SW Discharge Note   Patient Details  Name: Roberta Ramos MRN: [id number] Date of Birth: 01-24-1961  Transition of Care [facility]) CM/SW Contact:  Latanya Maudlin, RN Phone Number: 04/16/2018, 9:41 AM   Clinical Narrative:   Patient to be discharged per MD order. Orders in place for home health services. Patient currently open to [facility] home health and prefers to resume with them. Notified Malachy Mood of pending discharge. Patient has hospital bed, wheelchair and walker in home. Daughter reports no further DME needs. Family prefers to transport.     Final next level of care: Home w Home Health Services Barriers to Discharge: No Barriers Identified   Patient Goals and CMS Choice   CMS Medicare.gov Compare Post Acute Care list provided to:: Patient Represenative (must comment)(daughter) Choice offered to / list presented to : [place] / Guardian, Adult Children  Discharge Placement                       Discharge Plan and Services Discharge Planning Services: CM Consult Post Acute Care Choice: Home Health              HH Arranged: RN, PT, Nurse's Aide, Speech Therapy HH Agency: [place]   Social Determinants of Health (SDOH) Interventions     Readmission Risk Interventions No flowsheet data found.

## 2018-04-16 NOTE — Progress Notes (Signed)
Discharged home via wheelchair with sister

## 2018-04-16 NOTE — Discharge Instructions (Signed)
Protein-Energy Malnutrition  Protein-energy malnutrition is when a person does not eat enough protein, fat, and calories. When this happens over time, it can lead to severe loss of muscle tissue (muscle wasting). This condition also affects the body's defense system (immune system) and can lead to other health problems.  What are the causes?  This condition may be caused by:  · Not eating enough protein, fat, or calories.  · Having certain chronic medical conditions.  · Eating too little.  What increases the risk?  The following factors may make you more likely to develop this condition:  · Living in poverty.  · Long-term hospitalization.  · Alcohol or drug dependency. Addiction often leads to a lifestyle in which proper diet is ignored. Dependency can also hurt the metabolism and the body's ability to absorb nutrients.  · Eating disorders, such as anorexia nervosa or bulimia.  · Chewing or swallowing problems. People with these disorders may not eat enough.  · Having certain conditions, such as:  ? Inflammatory bowel disease. Inflammation of the intestines makes it difficult for the body to absorb nutrients.  ? Cancer or AIDS. These diseases can cause a loss of appetite.  ? Chronic heart failure. This interferes with how the body uses nutrients.  ? Cystic fibrosis. This disease can make it difficult for the body to absorb nutrients.  · Eating a diet that extremely restricts protein, fat, or calorie intake.  What are the signs or symptoms?  Symptoms of this condition include:  · Fatigue.  · Weakness.  · Dizziness.  · Fainting.  · Weight loss.  · Loss of muscle tone and muscle mass.  · Poor immune response.  · Lack of menstruation.  · Poor memory.  · Hair loss.  · Skin changes.  How is this diagnosed?  This condition may be diagnosed based on:  · Your medical and dietary history.  · A physical exam. This may include a measurement of your body mass index (BMI).  · Blood tests.  How is this treated?  This condition may  be managed with:  · Nutrition therapy. This may include working with a diet and nutrition specialist (dietitian).  · Treatment for underlying conditions.  People with severe protein-energy malnutrition may need to be treated in a hospital. This may involve receiving nutrition and fluids through an IV.  Follow these instructions at home:    · Eat a balanced diet. In each meal, include at least one food that is high in protein. Foods that are high in protein include:  ? Meat.  ? Poultry.  ? Fish.  ? Eggs.  ? Cheese.  ? Milk.  ? Beans.  ? Nuts.  · Eat nutrient-rich foods that are easy to swallow and digest, such as:  ? Fruit and yogurt smoothies.  ? Oatmeal with nut butter.  · Try to eat six small meals each day instead of three large meals.  · Take vitamin and protein supplements as told by your health care provider or dietitian.  · Follow your health care provider's recommendations about exercise and activity.  · Keep all follow-up visits as told by your health care provider. This is important.  Contact a health care provider if you:  · Have increased weakness or fatigue.  · Faint.  · Are a woman and you stop having your period (menstruating).  · Have rapid hair loss.  · Have unexpected weight loss.  · Have diarrhea.  · Have nausea and vomiting.  Get   help right away if you have:  · Difficulty breathing.  · Chest pain.  Summary  · Protein-energy malnutrition is when a person does not eat enough protein, fat, and calories.  · Protein-energy malnutrition can lead to severe loss of muscle tissue (muscle wasting). This condition also affects the body's defense system (immune system) and can lead to other health problems.  · Talk with your health care provider about treatment for this condition. Effective treatment depends on the underlying cause of the malnutrition.  This information is not intended to replace advice given to you by your health care provider. Make sure you discuss any questions you have with your health  care provider.  Document Released: 12/04/2004 Document Revised: 02/02/2017 Document Reviewed: 02/02/2017  Elsevier Interactive Patient Education © 2019 [employer]

## 2018-04-17 ENCOUNTER — Emergency Department: Payer: Medicare Other

## 2018-04-17 ENCOUNTER — Other Ambulatory Visit: Payer: Self-pay

## 2018-04-17 ENCOUNTER — Observation Stay
Admission: EM | Admit: 2018-04-17 | Discharge: 2018-04-18 | Disposition: A | Discharge status: Home / Self Care | Payer: Medicare Other | Attending: Internal Medicine | Admitting: Internal Medicine

## 2018-04-17 DIAGNOSIS — K5900 Constipation, unspecified: Secondary | ICD-10-CM | POA: Insufficient documentation

## 2018-04-17 DIAGNOSIS — M4185 Other forms of scoliosis, thoracolumbar region: Secondary | ICD-10-CM | POA: Diagnosis not present

## 2018-04-17 DIAGNOSIS — G40909 Epilepsy, unspecified, not intractable, without status epilepticus: Secondary | ICD-10-CM | POA: Diagnosis not present

## 2018-04-17 DIAGNOSIS — M1990 Unspecified osteoarthritis, unspecified site: Secondary | ICD-10-CM | POA: Diagnosis not present

## 2018-04-17 DIAGNOSIS — R319 Hematuria, unspecified: Secondary | ICD-10-CM

## 2018-04-17 DIAGNOSIS — R109 Unspecified abdominal pain: Secondary | ICD-10-CM

## 2018-04-17 DIAGNOSIS — E871 Hypo-osmolality and hyponatremia: Secondary | ICD-10-CM | POA: Diagnosis not present

## 2018-04-17 DIAGNOSIS — E860 Dehydration: Secondary | ICD-10-CM | POA: Diagnosis not present

## 2018-04-17 DIAGNOSIS — E876 Hypokalemia: Secondary | ICD-10-CM | POA: Diagnosis not present

## 2018-04-17 DIAGNOSIS — R000 Tachycardia, unspecified: Secondary | ICD-10-CM | POA: Diagnosis not present

## 2018-04-17 DIAGNOSIS — E870 Hyperosmolality and hypernatremia: Secondary | ICD-10-CM | POA: Diagnosis present

## 2018-04-17 DIAGNOSIS — J449 Chronic obstructive pulmonary disease, unspecified: Secondary | ICD-10-CM | POA: Insufficient documentation

## 2018-04-17 DIAGNOSIS — R627 Adult failure to thrive: Secondary | ICD-10-CM | POA: Diagnosis not present

## 2018-04-17 DIAGNOSIS — E43 Unspecified severe protein-calorie malnutrition: Secondary | ICD-10-CM | POA: Diagnosis present

## 2018-04-17 DIAGNOSIS — Z79899 Other long term (current) drug therapy: Secondary | ICD-10-CM | POA: Insufficient documentation

## 2018-04-17 DIAGNOSIS — G809 Cerebral palsy, unspecified: Secondary | ICD-10-CM | POA: Diagnosis not present

## 2018-04-17 DIAGNOSIS — A419 Sepsis, unspecified organism: Secondary | ICD-10-CM | POA: Insufficient documentation

## 2018-04-17 DIAGNOSIS — R509 Fever, unspecified: Secondary | ICD-10-CM

## 2018-04-17 LAB — PROTIME-INR
INR: 1.2 (ref 0.8–1.2)
Prothrombin Time: 15.4 seconds — ABNORMAL HIGH (ref 11.4–15.2)

## 2018-04-17 LAB — CBC WITH DIFFERENTIAL/PLATELET
Abs Immature Granulocytes: 0.02 10*3/uL (ref 0.00–0.07)
Basophils Absolute: 0.1 10*3/uL (ref 0.0–0.1)
Basophils Relative: 1 %
Eosinophils Absolute: 0.2 10*3/uL (ref 0.0–0.5)
Eosinophils Relative: 2 %
HCT: 35.1 % — ABNORMAL LOW (ref 36.0–46.0)
Hemoglobin: 12.1 g/dL (ref 12.0–15.0)
Immature Granulocytes: 0 %
Lymphocytes Relative: 24 %
Lymphs Abs: 2 10*3/uL (ref 0.7–4.0)
MCH: 31.2 pg (ref 26.0–34.0)
MCHC: 34.5 g/dL (ref 30.0–36.0)
MCV: 90.5 fL (ref 80.0–100.0)
Monocytes Absolute: 0.9 10*3/uL (ref 0.1–1.0)
Monocytes Relative: 11 %
Neutro Abs: 5.1 10*3/uL (ref 1.7–7.7)
Neutrophils Relative %: 62 %
Platelets: 278 10*3/uL (ref 150–400)
RBC: 3.88 MIL/uL (ref 3.87–5.11)
RDW: 15.9 % — ABNORMAL HIGH (ref 11.5–15.5)
WBC: 8.2 10*3/uL (ref 4.0–10.5)
nRBC: 0 % (ref 0.0–0.2)

## 2018-04-17 LAB — URINALYSIS, COMPLETE (UACMP) WITH MICROSCOPIC
Bacteria, UA: NONE SEEN
Bilirubin Urine: NEGATIVE
GLUCOSE, UA: NEGATIVE mg/dL
Ketones, ur: 20 mg/dL — AB
Leukocytes,Ua: NEGATIVE
Nitrite: NEGATIVE
Protein, ur: NEGATIVE mg/dL
Specific Gravity, Urine: 1.017 (ref 1.005–1.030)
pH: 5 (ref 5.0–8.0)

## 2018-04-17 LAB — COMPREHENSIVE METABOLIC PANEL
ALT: 29 U/L (ref 0–44)
AST: 40 U/L (ref 15–41)
Albumin: 4.1 g/dL (ref 3.5–5.0)
Alkaline Phosphatase: 121 U/L (ref 38–126)
Anion gap: 11 (ref 5–15)
BILIRUBIN TOTAL: 0.5 mg/dL (ref 0.3–1.2)
BUN: 13 mg/dL (ref 6–20)
CO2: 21 mmol/L — ABNORMAL LOW (ref 22–32)
Calcium: 8.6 mg/dL — ABNORMAL LOW (ref 8.9–10.3)
Chloride: 117 mmol/L — ABNORMAL HIGH (ref 98–111)
Creatinine, Ser: 0.36 mg/dL — ABNORMAL LOW (ref 0.44–1.00)
GFR calc Af Amer: >60 mL/min (ref >60)
GFR calc non Af Amer: >60 mL/min (ref >60)
Glucose, Bld: 102 mg/dL — ABNORMAL HIGH (ref 70–99)
Potassium: 3.3 mmol/L — ABNORMAL LOW (ref 3.5–5.1)
Sodium: 149 mmol/L — ABNORMAL HIGH (ref 135–145)
Total Protein: 7.3 g/dL (ref 6.5–8.1)

## 2018-04-17 LAB — LACTIC ACID, PLASMA: Lactic Acid, Venous: 1.5 mmol/L (ref 0.5–1.9)

## 2018-04-17 MED ORDER — SODIUM CHLORIDE 0.9% FLUSH
3.0000 mL | Freq: Once | INTRAVENOUS | Status: AC
  Administered 2018-04-17: 3 mL via INTRAVENOUS

## 2018-04-17 MED ORDER — VANCOMYCIN HCL IN DEXTROSE 750-5 MG/150ML-% IV SOLN
750.0000 mg | INTRAVENOUS | Status: AC
  Administered 2018-04-17: 750 mg via INTRAVENOUS
  Filled 2018-04-17: qty 150

## 2018-04-17 MED ORDER — MORPHINE SULFATE (PF) 4 MG/ML IV SOLN
4.0000 mg | Freq: Once | INTRAVENOUS | Status: AC
  Administered 2018-04-17: 4 mg via INTRAVENOUS
  Filled 2018-04-17: qty 1

## 2018-04-17 MED ORDER — ONDANSETRON HCL 4 MG/2ML IJ SOLN
4.0000 mg | Freq: Once | INTRAMUSCULAR | Status: AC
  Administered 2018-04-17: 4 mg via INTRAVENOUS
  Filled 2018-04-17: qty 2

## 2018-04-17 MED ORDER — ENOXAPARIN SODIUM 40 MG/0.4ML ~~LOC~~ SOLN: 40.0000 mg | SUBCUTANEOUS | Status: DC

## 2018-04-17 MED ORDER — SODIUM CHLORIDE 0.9 % IV SOLN
1.0000 g | Freq: Once | INTRAVENOUS | Status: AC
  Administered 2018-04-17: 1 g via INTRAVENOUS
  Filled 2018-04-17: qty 10

## 2018-04-17 MED ORDER — SODIUM CHLORIDE 0.9 % IV SOLN
1.0000 g | INTRAVENOUS | Status: DC
  Filled 2018-04-17: qty 10

## 2018-04-17 MED ORDER — ACETAMINOPHEN 325 MG PO TABS: 650.0000 mg | ORAL_TABLET | Freq: Four times a day (QID) | ORAL | Status: DC | PRN

## 2018-04-17 MED ORDER — HEPARIN SODIUM (PORCINE) 5000 UNIT/ML IJ SOLN
5000.0000 [IU] | Freq: Two times a day (BID) | INTRAMUSCULAR | Status: DC
  Administered 2018-04-18: 01:00:00 5000 [IU] via SUBCUTANEOUS
  Filled 2018-04-17: qty 1

## 2018-04-17 MED ORDER — DOCUSATE SODIUM 100 MG PO CAPS
100.0000 mg | ORAL_CAPSULE | Freq: Two times a day (BID) | ORAL | Status: DC
  Administered 2018-04-18: 100 mg via ORAL
  Filled 2018-04-17: qty 1

## 2018-04-17 MED ORDER — LACTULOSE 10 GM/15ML PO SOLN
20.0000 g | Freq: Every day | ORAL | Status: DC
  Administered 2018-04-18: 20 g via ORAL
  Filled 2018-04-17: qty 30

## 2018-04-17 MED ORDER — ACETAMINOPHEN 650 MG RE SUPP: 650.0000 mg | Freq: Four times a day (QID) | RECTAL | Status: DC | PRN

## 2018-04-17 MED ORDER — SODIUM CHLORIDE 0.9 % IV BOLUS
1000.0000 mL | Freq: Once | INTRAVENOUS | Status: AC
  Administered 2018-04-17: 1000 mL via INTRAVENOUS

## 2018-04-17 MED ORDER — SODIUM CHLORIDE 0.9 % IV SOLN
INTRAVENOUS | Status: DC
  Administered 2018-04-18: via INTRAVENOUS

## 2018-04-17 NOTE — ED Notes (Signed)
ED TO INPATIENT HANDOFF REPORT  ED Nurse Name and Phone #: Zyree Traynham [phone]  S Name/Age/Gender Roberta Ramos 58 y.o. female Room/Bed: ED24A/ED24A  Code Status   Code Status: Prior  Home/SNF/Other Home Patient oriented to: self None Is this baseline? Yes   Triage Complete: Triage complete  Chief Complaint [facility] Fever  Triage Note PT to ed via ems from home. PT was d/c yesterday with dx of UTI. Caregiver states pt was not sent home with antibiotics, so pt has not had any further doses. Pt appears to be uncomfortable at this time. Caregiver reports that pt is restless and decreased appetite.    Allergies Allergies  Allergen Reactions  . Potassium-Containing Compounds Hives and Swelling    With IV products only, reviewed with family 04/15/18  . Dairy Aid [Lactase] Other (See Comments)    Stomach pains and constipation    Level of Care/Admitting Diagnosis ED Disposition    ED Disposition [facility]: [place] [[id number]]  Level of Care: Med-Surg [16]  Diagnosis: Sepsis [facility]) [[id number]]  Admitting Physician: Harrie Foreman [[id number]]  Attending Physician: Harrie Foreman [[id number]]  Estimated length of stay: past midnight tomorrow  Certification:: I certify this patient will need inpatient services for at least 2 midnights  PT Class (Do Not Modify): Inpatient [101]  PT Acc Code (Do Not Modify): Private [1]       B Medical/Surgery History Past Medical History:  Diagnosis Date  . Arthritis   . Cerebral palsy ([place])   . COPD (chronic obstructive pulmonary disease) ([place])   . Seizures ([place])    Past Surgical History:  Procedure Laterality Date  . none       A IV Location/Drains/Wounds Patient Lines/Drains/Airways Status   Active Line/Drains/Airways    Name:   Placement date:   Placement time:   Site:   Days:   Peripheral IV 04/17/18 Right Forearm   04/17/18    1855    Forearm   less than 1   Peripheral IV  04/17/18 Right Hand   04/17/18    1928    Hand   less than 1   External Urinary Catheter   04/13/18    1740    -   4   External Urinary Catheter   04/13/18    -    -   4   Pressure Injury 04/06/18 Stage II -  Partial thickness loss of dermis presenting as a shallow open ulcer with a red, pink wound bed without slough.   04/06/18    1804     11          Intake/Output Last 24 hours  Intake/Output Summary (Last 24 hours) at 04/17/2018 2223 Last data filed at 04/17/2018 2157 Gross per 24 hour  Intake 1000 ml  Output -  Net 1000 ml    Labs/Imaging Results for orders placed or performed during the hospital encounter of 04/17/18 (from the past 48 hour(s))  Comprehensive metabolic panel     Status: Abnormal   Collection Time: 04/17/18  7:12 PM  Result Value Ref Range   Sodium 149 (H) 135 - 145 mmol/L   Potassium 3.3 (L) 3.5 - 5.1 mmol/L   Chloride 117 (H) 98 - 111 mmol/L   CO2 21 (L) 22 - 32 mmol/L   Glucose, Bld 102 (H) 70 - 99 mg/dL   BUN 13 6 - 20 mg/dL   Creatinine, Ser 0.36 (L) 0.44 -  1.00 mg/dL   Calcium 8.6 (L) 8.9 - 10.3 mg/dL   Total Protein 7.3 6.5 - 8.1 g/dL   Albumin 4.1 3.5 - 5.0 g/dL   AST 40 15 - 41 U/L   ALT 29 0 - 44 U/L   Alkaline Phosphatase 121 38 - 126 U/L   Total Bilirubin 0.5 0.3 - 1.2 mg/dL   GFR calc non Af Amer >60 >60 mL/min   GFR calc Af Amer >60 >60 mL/min   Anion gap 11 5 - 15    Comment: Performed at [facility], [address] [place], [place] [address]  CBC with Differential     Status: Abnormal   Collection Time: 04/17/18  7:12 PM  Result Value Ref Range   WBC 8.2 4.0 - 10.5 K/uL   RBC 3.88 3.87 - 5.11 MIL/uL   Hemoglobin 12.1 12.0 - 15.0 g/dL   HCT 35.1 (L) 36.0 - 46.0 %   MCV 90.5 80.0 - 100.0 fL   MCH 31.2 26.0 - 34.0 pg   MCHC 34.5 30.0 - 36.0 g/dL   RDW 15.9 (H) 11.5 - 15.5 %   Platelets 278 150 - 400 K/uL   nRBC 0.0 0.0 - 0.2 %   Neutrophils Relative % 62 %   Neutro Abs 5.1 1.7 - 7.7 K/uL   Lymphocytes Relative 24 %    Lymphs Abs 2.0 0.7 - 4.0 K/uL   Monocytes Relative 11 %   Monocytes Absolute 0.9 0.1 - 1.0 K/uL   Eosinophils Relative 2 %   Eosinophils Absolute 0.2 0.0 - 0.5 K/uL   Basophils Relative 1 %   Basophils Absolute 0.1 0.0 - 0.1 K/uL   Immature Granulocytes 0 %   Abs Immature Granulocytes 0.02 0.00 - 0.07 K/uL    Comment: Performed at [facility], [address] [place], [place] [address]  Protime-INR     Status: Abnormal   Collection Time: 04/17/18  7:12 PM  Result Value Ref Range   Prothrombin Time 15.4 (H) 11.4 - 15.2 seconds   INR 1.2 0.8 - 1.2    Comment: (NOTE) INR goal varies based on device and disease states. Performed at [facility], [address] [place], [place] [address]   Urinalysis, Complete w Microscopic     Status: Abnormal   Collection Time: 04/17/18  7:12 PM  Result Value Ref Range   Color, Urine STRAW (A) YELLOW   APPearance CLEAR (A) CLEAR   Specific Gravity, Urine 1.017 1.005 - 1.030   pH 5.0 5.0 - 8.0   Glucose, UA NEGATIVE NEGATIVE mg/dL   Hgb urine dipstick MODERATE (A) NEGATIVE   Bilirubin Urine NEGATIVE NEGATIVE   Ketones, ur 20 (A) NEGATIVE mg/dL   Protein, ur NEGATIVE NEGATIVE mg/dL   Nitrite NEGATIVE NEGATIVE   Leukocytes,Ua NEGATIVE NEGATIVE   RBC / HPF 11-20 0 - 5 RBC/hpf   WBC, UA 0-5 0 - 5 WBC/hpf   Bacteria, UA NONE SEEN NONE SEEN   Squamous Epithelial / LPF 0-5 0 - 5   Mucus PRESENT     Comment: Performed at [facility], [address] [place], [place] [address]  Lactic acid, plasma     Status: None   Collection Time: 04/17/18  9:06 PM  Result Value Ref Range   Lactic Acid, Venous 1.5 0.5 - 1.9 mmol/L    Comment: Performed at [facility], [address] [place], [place] [address]   Dg Chest Port 1 View  Result Date:  04/17/2018 CLINICAL DATA:  Sepsis EXAM: PORTABLE CHEST 1 VIEW COMPARISON:  04/13/2018 FINDINGS: Severe thoracolumbar scoliosis. Heart and mediastinal contours are within normal  limits. No focal opacities or effusions. No acute bony abnormality. IMPRESSION: No active disease. Electronically Signed   By: Rolm Baptise M.D.   On: 04/17/2018 19:45   Ct Renal Stone Study  Result Date: 04/17/2018 CLINICAL DATA:  Hematuria EXAM: CT ABDOMEN AND PELVIS WITHOUT CONTRAST TECHNIQUE: Multidetector CT imaging of the abdomen and pelvis was performed following the standard protocol without IV contrast. COMPARISON:  09/28/2016 FINDINGS: Lower chest: No acute abnormality Hepatobiliary: No focal hepatic abnormality. Gallbladder unremarkable. Pancreas: No focal abnormality or ductal dilatation. Spleen: No focal abnormality.  Normal size. Adrenals/Urinary Tract: No renal or ureteral stones. No renal or adrenal mass. No hydronephrosis. Urinary bladder decompressed, Stomach/Bowel: Severely increased stool burden throughout the colon. Large amount of stool in the rectosigmoid colon. Cannot exclude fecal impaction. Stomach and small bowel decompressed. Vascular/Lymphatic: No evidence of aneurysm or adenopathy. Reproductive: Uterus and adnexa unremarkable.  No mass. Other: No free fluid or free air. Musculoskeletal: Severe rightward thoracolumbar scoliosis. No acute bony abnormality. IMPRESSION: Severely increased stool burden throughout the colon. Cannot exclude fecal impaction. No renal or ureteral stones.  No hydronephrosis. Electronically Signed   By: Rolm Baptise M.D.   On: 04/17/2018 20:53    Pending Labs Unresulted Labs (From admission, onward)    Start     Ordered   04/17/18 2015  Urine culture  Once,   STAT     04/17/18 2015   04/17/18 1906  Culture, blood (Routine x 2)  BLOOD CULTURE X 2,   STAT     04/17/18 1905   Signed and Held  Creatinine, serum  (enoxaparin (LOVENOX)    CrCl >/= 30 ml/min)  Weekly,   R    Comments:  while on enoxaparin therapy    Signed and Held   Signed and Held  TSH  Add-on,   R     Signed and Held          Vitals/Pain Today's Vitals   04/17/18 1930  04/17/18 2015 04/17/18 2030 04/17/18 2130  BP: (!) 146/119  (!) 156/94 129/78  Pulse: (!) 134  (!) 133 (!) 106  Resp: (!) 23  (!) 25 12  Temp:  99.8 F (37.7 C)    TempSrc:  Rectal    SpO2: 96%  97% 95%  Weight:      Height:        Isolation Precautions No active isolations  Medications Medications  vancomycin (VANCOCIN) IVPB 750 mg/150 ml premix (has no administration in time range)  sodium chloride flush (NS) 0.9 % injection 3 mL (3 mLs Intravenous Given 04/17/18 1944)  sodium chloride 0.9 % bolus 1,000 mL (0 mLs Intravenous Stopped 04/17/18 2157)  cefTRIAXone (ROCEPHIN) 1 g in sodium chloride 0.9 % 100 mL IVPB (0 g Intravenous Stopped 04/17/18 2110)  morphine 4 MG/ML injection 4 mg (4 mg Intravenous Given 04/17/18 2030)  ondansetron (ZOFRAN) injection 4 mg (4 mg Intravenous Given 04/17/18 2029)    Mobility non-ambulatory Low fall risk   Focused Assessments Cardiac Assessment Handoff:  Cardiac Rhythm: Sinus tachycardia Lab Results  Component Value Date   TROPONINI <0.03 04/13/2018   No results found for: DDIMER Does the Patient currently have chest pain? UTA    R Recommendations: See Admitting Provider Note  Report given to:   Additional Notes: pt sister is caregiver, she is at bedside. Pt does  not have a HPA but family makes decisions

## 2018-04-17 NOTE — ED Triage Notes (Signed)
PT to ed via ems from home. PT was d/c yesterday with dx of UTI. Caregiver states pt was not sent home with antibiotics, so pt has not had any further doses. Pt appears to be uncomfortable at this time. Caregiver reports that pt is restless and decreased appetite.

## 2018-04-17 NOTE — ED Notes (Signed)
PT cleaned of incontinent stool and new brief applied

## 2018-04-17 NOTE — ED Notes (Signed)
Floor RN unable to take report at this time.

## 2018-04-17 NOTE — ED Notes (Signed)
Patient transported to CT 

## 2018-04-17 NOTE — ED Provider Notes (Signed)
[facility] Emergency Department Provider Note   ____________________________________________   First MD Initiated Contact with Patient 04/17/18 1941     (approximate)  I have reviewed the triage vital signs and the nursing notes.   HISTORY  Chief Complaint Fever and Recurrent UTI History obtained from sister as patient has bad cerebral palsy and is not giving history.  HPI Roberta Ramos is a 58 y.o. female patient with history of cerebral palsy and repeated admissions for sepsis was here few days ago with UTI.  She got a dose of Rocephin i in the hospital but did not get any antibiotics at home.  She developed a fever at home.  She also had abdominal pain which seem to be diffuse.  She came into the hospital to be checked.         Past Medical History:  Diagnosis Date  . Arthritis   . Cerebral palsy ([place])   . COPD (chronic obstructive pulmonary disease) ([place])   . Seizures [facility])     Patient Active Problem List   Diagnosis Date Noted  . Sepsis secondary to UTI ([place]) 04/13/2018  . Aspiration pneumonia ([place]) 04/06/2018  . Pressure injury of skin 10/01/2016  . Sepsis ([place]) 09/29/2016  . Protein-calorie malnutrition, severe 09/29/2016  . Calculus of gallbladder without cholecystitis without obstruction     Past Surgical History:  Procedure Laterality Date  . none      Prior to Admission medications   Medication Sig Start Date End Date Taking? Authorizing Provider  albuterol (PROVENTIL) (2.5 MG/3ML) 0.083% nebulizer solution Take 2.5 mg by nebulization every 4 (four) hours as needed for wheezing or shortness of breath.     [provider]  budesonide (PULMICORT) 0.5 MG/2ML nebulizer solution Take 0.5 mg by nebulization 2 (two) times daily.    [provider]  diazepam (VALIUM) 5 MG tablet Take 5 mg by mouth 3 (three) times daily as needed for anxiety or muscle spasms.    [provider]  ipratropium (ATROVENT) 0.02  % nebulizer solution Take 0.5 mg by nebulization every 4 (four) hours as needed for wheezing or shortness of breath.     [provider]  loratadine (CLARITIN) 5 MG/5ML syrup Take 5 mg by mouth at bedtime.    [provider]  mupirocin ointment (BACTROBAN) 2 % APPLY 3 TIMES A DAY TO AFFECTED AREA 04/06/18   [provider]  phenytoin (DILANTIN) 125 MG/5ML suspension Take 3.5 mLs (87.5 mg total) by mouth 2 (two) times daily. Please do not take dose for evening of March 6 and morning of March 7 then resume dose 04/07/18   [name], MD  polyethylene glycol (MIRALAX / GLYCOLAX) packet Take 17 g by mouth daily as needed for mild constipation or moderate constipation.     [provider]  ranitidine (ZANTAC) 150 MG/10ML syrup Take 112.5 mg by mouth 2 (two) times daily.    [provider]  simethicone (MYLICON) 80 MG chewable tablet Chew 80 mg by mouth as needed for flatulence.    [provider]    Allergies Potassium-containing compounds and Dairy aid [lactase]  Family History  Problem Relation Age of Onset  . Hypertension Mother   . Diabetes Mellitus II Brother     Social History Social History   Tobacco Use  . Smoking status: Never Smoker  . Smokeless tobacco: Never Used  Substance Use Topics  . Alcohol use: No  . Drug use: No  Review of Systems  Review of systems unobtainable  ____________________________________________   PHYSICAL EXAM:  VITAL SIGNS: ED Triage Vitals  Enc Vitals Group     BP 04/17/18 1930 (!) 146/119     Pulse Rate 04/17/18 1902 (!) 135     Resp 04/17/18 1902 (!) 31     Temp 04/17/18 1902 99 F (37.2 C)     Temp Source 04/17/18 1902 Oral     SpO2 04/17/18 1902 100 %     Weight 04/17/18 1903 57 lb 5.1 oz (26 kg)     Height 04/17/18 1903 5\' 4"  (1.626 m)     Head Circumference --      Peak Flow --      Pain Score --      Pain Loc --      Pain Edu? --      Excl. in [place]? --     Constitutional:  Alert Eyes: Conjunctivae are normal.  Head: Atraumatic. Nose: No congestion/rhinnorhea. Mouth/Throat: Mucous membranes are moist.  Oropharynx non-erythematous. Neck: No stridor. Cardiovascular: Normal rate, regular rhythm. Grossly normal heart sounds.  Good peripheral circulation. Respiratory: Normal respiratory effort.  No retractions. Lungs CTAB. Gastrointestinal: Soft usually tender no distention. No abdominal bruits.  Musculoskeletal: No lower extremity tenderness nor edema Neurologic: At baseline Skin:  Skin is warm, dry and intact. No rash noted.   ____________________________________________   LABS (all labs ordered are listed, but only abnormal results are displayed)  Labs Reviewed  COMPREHENSIVE METABOLIC PANEL - Abnormal; Notable for the following components:      Result Value   Sodium 149 (*)    Potassium 3.3 (*)    Chloride 117 (*)    CO2 21 (*)    Glucose, Bld 102 (*)    Creatinine, Ser 0.36 (*)    Calcium 8.6 (*)    All other components within normal limits  CBC WITH DIFFERENTIAL/PLATELET - Abnormal; Notable for the following components:   HCT 35.1 (*)    RDW 15.9 (*)    All other components within normal limits  PROTIME-INR - Abnormal; Notable for the following components:   Prothrombin Time 15.4 (*)    All other components within normal limits  URINALYSIS, COMPLETE (UACMP) WITH MICROSCOPIC - Abnormal; Notable for the following components:   Color, Urine STRAW (*)    APPearance CLEAR (*)    Hgb urine dipstick MODERATE (*)    Ketones, ur 20 (*)    All other components within normal limits  CULTURE, BLOOD (ROUTINE X 2)  CULTURE, BLOOD (ROUTINE X 2)  URINE CULTURE  LACTIC ACID, PLASMA   ____________________________________________  EKG   ____________________________________________  RADIOLOGY  ED MD interpretation: CT read by radiology reviewed by me shows no acute diseas  Official radiology report(s): Dg Chest Port 1 View  Result Date:  04/17/2018 CLINICAL DATA:  Sepsis EXAM: PORTABLE CHEST 1 VIEW COMPARISON:  04/13/2018 FINDINGS: Severe thoracolumbar scoliosis. Heart and mediastinal contours are within normal limits. No focal opacities or effusions. No acute bony abnormality. IMPRESSION: No active disease. Electronically Signed   By: Rolm Baptise M.D.   On: 04/17/2018 19:45   Ct Renal Stone Study  Result Date: 04/17/2018 CLINICAL DATA:  Hematuria EXAM: CT ABDOMEN AND PELVIS WITHOUT CONTRAST TECHNIQUE: Multidetector CT imaging of the abdomen and pelvis was performed following the standard protocol without IV contrast. COMPARISON:  09/28/2016 FINDINGS: Lower chest: No acute abnormality Hepatobiliary: No focal hepatic abnormality. Gallbladder unremarkable. Pancreas: No focal abnormality or ductal dilatation. Spleen:  No focal abnormality.  Normal size. Adrenals/Urinary Tract: No renal or ureteral stones. No renal or adrenal mass. No hydronephrosis. Urinary bladder decompressed, Stomach/Bowel: Severely increased stool burden throughout the colon. Large amount of stool in the rectosigmoid colon. Cannot exclude fecal impaction. Stomach and small bowel decompressed. Vascular/Lymphatic: No evidence of aneurysm or adenopathy. Reproductive: Uterus and adnexa unremarkable.  No mass. Other: No free fluid or free air. Musculoskeletal: Severe rightward thoracolumbar scoliosis. No acute bony abnormality. IMPRESSION: Severely increased stool burden throughout the colon. Cannot exclude fecal impaction. No renal or ureteral stones.  No hydronephrosis. Electronically Signed   By: Rolm Baptise M.D.   On: 04/17/2018 20:53    ____________________________________________   PROCEDURES  Procedure(s) performed (including Critical Care):  Procedures   ____________________________________________   INITIAL IMPRESSION / ASSESSMENT AND PLAN / ED COURSE  Patient appears to be in distress and is tachycardic with abdominal pain.  CT does not show anything  with constipation but I think in view of her hyponatremia and other symptoms we can try and give her some IV fluid in the hospital and have her up constipation we see on the CT scan would be relieved.              ____________________________________________   FINAL CLINICAL IMPRESSION(S) / ED DIAGNOSES  Final diagnoses:  Abdominal pain, unspecified abdominal location  Constipation, unspecified constipation type  Fever, unspecified fever cause  Hematuria, unspecified type  Dehydration  Hypernatremia     ED Discharge Orders    None       Note:  This document was prepared using Dragon voice recognition software and may include unintentional dictation errors.    Nena Polio, MD 04/17/18 2150

## 2018-04-18 DIAGNOSIS — E860 Dehydration: Secondary | ICD-10-CM | POA: Diagnosis not present

## 2018-04-18 DIAGNOSIS — R109 Unspecified abdominal pain: Secondary | ICD-10-CM

## 2018-04-18 DIAGNOSIS — K5900 Constipation, unspecified: Secondary | ICD-10-CM

## 2018-04-18 LAB — CULTURE, BLOOD (ROUTINE X 2)
Culture: NO GROWTH
Culture: NO GROWTH

## 2018-04-18 LAB — TSH: TSH: 1.059 u[IU]/mL (ref 0.350–4.500)

## 2018-04-18 MED ORDER — BISACODYL 5 MG PO TBEC
10.0000 mg | DELAYED_RELEASE_TABLET | ORAL | Status: AC
  Administered 2018-04-18: 10 mg via ORAL
  Filled 2018-04-18: qty 2

## 2018-04-18 MED ORDER — DOCUSATE SODIUM 100 MG PO CAPS: 100.0000 mg | ORAL_CAPSULE | Freq: Two times a day (BID) | ORAL | 0 refills | Status: AC

## 2018-04-18 MED ORDER — POTASSIUM CHLORIDE 20 MEQ PO PACK
40.0000 meq | PACK | Freq: Once | ORAL | Status: AC
  Administered 2018-04-18: 40 meq via ORAL
  Filled 2018-04-18: qty 2

## 2018-04-18 MED ORDER — LACTULOSE 10 GM/15ML PO SOLN: 20.0000 g | Freq: Every day | ORAL | 0 refills | Status: AC

## 2018-04-18 MED ORDER — SENNOSIDES-DOCUSATE SODIUM 8.6-50 MG PO TABS
3.0000 | ORAL_TABLET | ORAL | Status: AC
  Administered 2018-04-18: 3 via ORAL
  Filled 2018-04-18: qty 3

## 2018-04-18 NOTE — H&P (Signed)
Roberta Ramos is an 58 y.o. female.   Chief Complaint: Fever; suspected UTI HPI: The patient with past medical history of cerebral palsy and failure to thrive presents to the emergency department due to fever.  She was recently discharged from the hospital due to UTI with sepsis.  Today she returns with diffuse abdominal pain as well as indications of sepsis.  The patient was given fluid in the emergency department as well as antibiotics prior to the hospital service being called for admission.  Past Medical History:  Diagnosis Date  . Arthritis   . Cerebral palsy ([place])   . COPD (chronic obstructive pulmonary disease) ([place])   . Seizures ([place])     Past Surgical History:  Procedure Laterality Date  . none      Family History  Problem Relation Age of Onset  . Hypertension Mother   . Diabetes Mellitus II Brother    Social History:  reports that she has never smoked. She has never used smokeless tobacco. She reports that she does not drink alcohol or use drugs.  Allergies:  Allergies  Allergen Reactions  . Potassium-Containing Compounds Hives and Swelling    With IV products only, reviewed with family 04/15/18  . Dairy Aid [Lactase] Other (See Comments)    Stomach pains and constipation    Medications Prior to Admission  Medication Sig Dispense Refill  . albuterol (PROVENTIL) (2.5 MG/3ML) 0.083% nebulizer solution Take 2.5 mg by nebulization every 4 (four) hours as needed for wheezing or shortness of breath.     . budesonide (PULMICORT) 0.5 MG/2ML nebulizer solution Take 0.5 mg by nebulization 2 (two) times daily.    . diazepam (VALIUM) 5 MG tablet Take 5 mg by mouth 3 (three) times daily as needed for anxiety or muscle spasms.    Marland Kitchen ipratropium (ATROVENT) 0.02 % nebulizer solution Take 0.5 mg by nebulization every 4 (four) hours as needed for wheezing or shortness of breath.     . loratadine (CLARITIN) 5 MG/5ML syrup Take 5 mg by mouth at bedtime.    . mupirocin ointment  (BACTROBAN) 2 % APPLY 3 TIMES A DAY TO AFFECTED AREA    . phenytoin (DILANTIN) 125 MG/5ML suspension Take 3.5 mLs (87.5 mg total) by mouth 2 (two) times daily. Please do not take dose for evening of March 6 and morning of March 7 then resume dose 237 mL 12  . polyethylene glycol (MIRALAX / GLYCOLAX) packet Take 17 g by mouth daily as needed for mild constipation or moderate constipation.     . ranitidine (ZANTAC) 150 MG/10ML syrup Take 112.5 mg by mouth 2 (two) times daily.    . simethicone (MYLICON) 80 MG chewable tablet Chew 80 mg by mouth as needed for flatulence.      Results for orders placed or performed during the hospital encounter of 04/17/18 (from the past 48 hour(s))  Comprehensive metabolic panel     Status: Abnormal   Collection Time: 04/17/18  7:12 PM  Result Value Ref Range   Sodium 149 (H) 135 - 145 mmol/L   Potassium 3.3 (L) 3.5 - 5.1 mmol/L   Chloride 117 (H) 98 - 111 mmol/L   CO2 21 (L) 22 - 32 mmol/L   Glucose, Bld 102 (H) 70 - 99 mg/dL   BUN 13 6 - 20 mg/dL   Creatinine, Ser 0.36 (L) 0.44 - 1.00 mg/dL   Calcium 8.6 (L) 8.9 - 10.3 mg/dL   Total Protein 7.3 6.5 - 8.1 g/dL  Albumin 4.1 3.5 - 5.0 g/dL   AST 40 15 - 41 U/L   ALT 29 0 - 44 U/L   Alkaline Phosphatase 121 38 - 126 U/L   Total Bilirubin 0.5 0.3 - 1.2 mg/dL   GFR calc non Af Amer >60 >60 mL/min   GFR calc Af Amer >60 >60 mL/min   Anion gap 11 5 - 15    Comment: Performed at [facility], [address] [place], [place] [address]  CBC with Differential     Status: Abnormal   Collection Time: 04/17/18  7:12 PM  Result Value Ref Range   WBC 8.2 4.0 - 10.5 K/uL   RBC 3.88 3.87 - 5.11 MIL/uL   Hemoglobin 12.1 12.0 - 15.0 g/dL   HCT 35.1 (L) 36.0 - 46.0 %   MCV 90.5 80.0 - 100.0 fL   MCH 31.2 26.0 - 34.0 pg   MCHC 34.5 30.0 - 36.0 g/dL   RDW 15.9 (H) 11.5 - 15.5 %   Platelets 278 150 - 400 K/uL   nRBC 0.0 0.0 - 0.2 %   Neutrophils Relative % 62 %   Neutro Abs 5.1 1.7 - 7.7 K/uL    Lymphocytes Relative 24 %   Lymphs Abs 2.0 0.7 - 4.0 K/uL   Monocytes Relative 11 %   Monocytes Absolute 0.9 0.1 - 1.0 K/uL   Eosinophils Relative 2 %   Eosinophils Absolute 0.2 0.0 - 0.5 K/uL   Basophils Relative 1 %   Basophils Absolute 0.1 0.0 - 0.1 K/uL   Immature Granulocytes 0 %   Abs Immature Granulocytes 0.02 0.00 - 0.07 K/uL    Comment: Performed at [facility], [address] [place], [place] [address]  Protime-INR     Status: Abnormal   Collection Time: 04/17/18  7:12 PM  Result Value Ref Range   Prothrombin Time 15.4 (H) 11.4 - 15.2 seconds   INR 1.2 0.8 - 1.2    Comment: (NOTE) INR goal varies based on device and disease states. Performed at [facility], [address] [place], [place] [address]   Urinalysis, Complete w Microscopic     Status: Abnormal   Collection Time: 04/17/18  7:12 PM  Result Value Ref Range   Color, Urine STRAW (A) YELLOW   APPearance CLEAR (A) CLEAR   Specific Gravity, Urine 1.017 1.005 - 1.030   pH 5.0 5.0 - 8.0   Glucose, UA NEGATIVE NEGATIVE mg/dL   Hgb urine dipstick MODERATE (A) NEGATIVE   Bilirubin Urine NEGATIVE NEGATIVE   Ketones, ur 20 (A) NEGATIVE mg/dL   Protein, ur NEGATIVE NEGATIVE mg/dL   Nitrite NEGATIVE NEGATIVE   Leukocytes,Ua NEGATIVE NEGATIVE   RBC / HPF 11-20 0 - 5 RBC/hpf   WBC, UA 0-5 0 - 5 WBC/hpf   Bacteria, UA NONE SEEN NONE SEEN   Squamous Epithelial / LPF 0-5 0 - 5   Mucus PRESENT     Comment: Performed at [facility], [address] [place], [place] [address]  TSH     Status: None   Collection Time: 04/17/18  7:12 PM  Result Value Ref Range   TSH 1.059 0.350 - 4.500 uIU/mL    Comment: Performed by a 3rd Generation assay with a functional sensitivity of <=0.01 uIU/mL. Performed at [facility], [place], [place] [address]   Lactic acid, plasma     Status: None   Collection Time: 04/17/18  9:06 PM  Result Value Ref  Range   Lactic Acid, Venous  1.5 0.5 - 1.9 mmol/L    Comment: Performed at [facility], [address] [place], [place] [address]   Dg Chest Port 1 View  Result Date: 04/17/2018 CLINICAL DATA:  Sepsis EXAM: PORTABLE CHEST 1 VIEW COMPARISON:  04/13/2018 FINDINGS: Severe thoracolumbar scoliosis. Heart and mediastinal contours are within normal limits. No focal opacities or effusions. No acute bony abnormality. IMPRESSION: No active disease. Electronically Signed   By: Rolm Baptise M.D.   On: 04/17/2018 19:45   Ct Renal Stone Study  Result Date: 04/17/2018 CLINICAL DATA:  Hematuria EXAM: CT ABDOMEN AND PELVIS WITHOUT CONTRAST TECHNIQUE: Multidetector CT imaging of the abdomen and pelvis was performed following the standard protocol without IV contrast. COMPARISON:  09/28/2016 FINDINGS: Lower chest: No acute abnormality Hepatobiliary: No focal hepatic abnormality. Gallbladder unremarkable. Pancreas: No focal abnormality or ductal dilatation. Spleen: No focal abnormality.  Normal size. Adrenals/Urinary Tract: No renal or ureteral stones. No renal or adrenal mass. No hydronephrosis. Urinary bladder decompressed, Stomach/Bowel: Severely increased stool burden throughout the colon. Large amount of stool in the rectosigmoid colon. Cannot exclude fecal impaction. Stomach and small bowel decompressed. Vascular/Lymphatic: No evidence of aneurysm or adenopathy. Reproductive: Uterus and adnexa unremarkable.  No mass. Other: No free fluid or free air. Musculoskeletal: Severe rightward thoracolumbar scoliosis. No acute bony abnormality. IMPRESSION: Severely increased stool burden throughout the colon. Cannot exclude fecal impaction. No renal or ureteral stones.  No hydronephrosis. Electronically Signed   By: Rolm Baptise M.D.   On: 04/17/2018 20:53    Review of Systems  Constitutional: Negative for chills and fever.  HENT: Negative for sore throat and tinnitus.   Eyes: Negative for blurred vision and redness.  Respiratory: Negative  for cough and shortness of breath.   Cardiovascular: Negative for chest pain, palpitations, orthopnea and PND.  Gastrointestinal: Negative for abdominal pain, diarrhea, nausea and vomiting.  Genitourinary: Negative for dysuria, frequency and urgency.  Musculoskeletal: Negative for joint pain and myalgias.  Skin: Negative for rash.       No lesions  Neurological: Negative for speech change, focal weakness and weakness.  Endo/Heme/Allergies: Does not bruise/bleed easily.       No temperature intolerance  Psychiatric/Behavioral: Negative for depression and suicidal ideas.    Blood pressure 114/68, pulse 93, temperature 98.3 F (36.8 C), temperature source Axillary, resp. rate 18, height 5\' 4"  (1.626 m), weight 26 kg, SpO2 99 %. Physical Exam  Vitals reviewed. Constitutional: She is oriented to person, place, and time. She appears well-developed and well-nourished. No distress.  HENT:  Head: Normocephalic and atraumatic.  Mouth/Throat: Oropharynx is clear and moist.  Eyes: Pupils are equal, round, and reactive to light. Conjunctivae and EOM are normal. No scleral icterus.  Neck: Normal range of motion. No JVD present. No tracheal deviation present. No thyromegaly present.  Cardiovascular: Normal rate, regular rhythm and normal heart sounds. Exam reveals no gallop and no friction rub.  No murmur heard. Respiratory: [place] normal and breath sounds normal.  GI: Soft. Bowel sounds are normal. She exhibits no distension. There is no abdominal tenderness.  Genitourinary:    Genitourinary Comments: Deferred   Musculoskeletal: Normal range of motion.        General: No edema.  Lymphadenopathy:    She has no cervical adenopathy.  Neurological: She is alert and oriented to person, place, and time. No cranial nerve deficit. She exhibits normal muscle tone.  Skin: Skin is warm and dry. No rash noted. No  erythema.  Psychiatric: She has a normal mood and affect. Her behavior is normal. Judgment and  thought content normal.     Assessment/Plan This is a 58 year old female admitted for sepsis. 1.  Sepsis: The patient meets criteria via tachycardia and tachypnea.  She is hemodynamically stable.  Source appears to be urine.  Continue antibiotics.  Follow blood cultures and urine cultures for growth and sensitivities. 2.  Hypokalemia: Replete potassium 3.  Epilepsy: Stable.  Resume Dilantin once verified by pharmacy.   4.  Failure to thrive: BMI is 9.8; encourage p.o. intake. 5.  Constipation: May be contributing to abdominal pain.  Lactulose daily. 6.  DVT prophylaxis: SCDs 7.  GI prophylaxis: None The patient is a full code.  Time spent on admission orders and patient care approximately 45 minutes  Harrie Foreman, MD 04/18/2018, 3:33 AM

## 2018-04-18 NOTE — Progress Notes (Deleted)
x

## 2018-04-18 NOTE — Discharge Instructions (Signed)
Please keep up a daily bowel regimen for Ms. Roberta Ramos, especially if she is taking pain medication. Chronic constipation can cause severe abdominal pain. It is better for her to have softer stools than infrequent ones.    Continue colace 2 times daily, lactulose 30 mL (20 g total) by mouth daily, and miralax daily.

## 2018-04-18 NOTE — Care Management CC44 (Signed)
Condition Code 44 Documentation Completed  Patient Details  Name: Roberta Ramos MRN: [id number] Date of Birth: [birth year]   Condition Code 44 given:  Yes Patient signature on Condition Code 44 notice:  Yes Documentation of 2 MD's agreement:  Yes Code 44 added to claim:  Yes    Shelbie Ammons, RN 04/18/2018, 9:23 AM

## 2018-04-18 NOTE — TOC Initial Note (Signed)
Transition of Care [facility]) - Initial/Assessment Note    Patient Details  Name: Roberta Ramos MRN: [id number] Date of Birth: 1960/03/24  [facility] CM/SW Contact:    Roberta Ammons, RN Phone Number: 04/18/2018, 9:37 AM  Clinical Narrative:    Re-admitted to [facility] with the diagnosis fever under observation status. Lives with sister Roberta Ramos [phone]). Last seen Roberta Ramos 03/17/18. [facility].  Sister helps with all activities of daily living. Hospital bed in the home. Followed by [facility] for skilled nursing.  Will update Roberta Ramos, representative for [employer]. No falls. Fair appetite.    Family will transport.             Expected Discharge Plan: [place] Barriers to Discharge: No Barriers Identified   Patient Goals and CMS Choice Patient states their goals for this hospitalization and ongoing recovery are:: (Sister, Roberta Ramos is caregiver: going home) CMS Medicare.gov Compare Post Acute Care list provided to:: Patient Represenative (must comment)(Yes, [facility] comes weekly) Choice offered to / list presented to : [facility] POA / Guardian(Sister, Roberta Ramos)  Expected Discharge Plan and Services Expected Discharge Plan: [place] Discharge Planning Services: CM Consult Post Acute Care Choice: [place] arrangements for the past 2 months: Single Family Home Expected Discharge Date: 04/18/18               DME Arranged: (none ordered. Hospital bed in the home) DME Agency: (none ordered) HH Arranged: RN Roberta Ramos Agency: [place]  Prior Living Arrangements/Services Living arrangements for the past 2 months: [place] Lives with:: Relatives Patient language and need for interpreter reviewed:: No Do you feel safe going back to the place where you live?: Yes      Need for Family Participation in Patient Care: Yes (Comment) Care giver support system in  place?: Yes (comment) Current home services: Home RN Criminal Activity/Legal Involvement Pertinent to Current Situation/Hospitalization: Yes - Comment as needed  Activities of Daily Living Home Assistive Devices/Equipment: Hospital bed ADL Screening (condition at time of admission) Patient's cognitive ability adequate to safely complete daily activities?: No Is the patient deaf or have difficulty hearing?: No Does the patient have difficulty seeing, even when wearing glasses/contacts?: No Does the patient have difficulty concentrating, remembering, or making decisions?: Yes Patient able to express need for assistance with ADLs?: No Does the patient have difficulty dressing or bathing?: Yes Independently performs ADLs?: No Communication: Dependent Is this a change from baseline?: Pre-admission baseline Dressing (OT): Dependent Is this a change from baseline?: Pre-admission baseline Grooming: Dependent Is this a change from baseline?: Pre-admission baseline Feeding: Dependent Is this a change from baseline?: Pre-admission baseline Bathing: Dependent Is this a change from baseline?: Pre-admission baseline Toileting: Dependent Is this a change from baseline?: Pre-admission baseline In/Out Bed: Dependent Is this a change from baseline?: Pre-admission baseline Walks in Home: Dependent Is this a change from baseline?: Pre-admission baseline Does the patient have difficulty walking or climbing stairs?: Yes Weakness of Legs: Both Weakness of Arms/Hands: Both  Permission Sought/Granted Permission sought to share information with : Case Manager Permission granted to share information with : Yes, Verbal Permission Granted              Emotional Assessment Appearance:: (Cerebral Palsy) Attitude/Demeanor/Rapport: Lethargic Affect (typically observed): Accepting, Adaptable Orientation: : Oriented to Self Alcohol / Substance Use: Never Used Psych Involvement: No (comment)  Admission  diagnosis:  Dehydration [  E86.0] Hypernatremia [E87.0] Sepsis ([place]) [A41.9] Abdominal pain, unspecified abdominal location [R10.9] Fever, unspecified fever cause [R50.9] Constipation, unspecified constipation type [K59.00] Hematuria, unspecified type [R31.9] Patient Active Problem List   Diagnosis Date Noted  . Sepsis secondary to UTI ([place]) 04/13/2018  . Aspiration pneumonia ([place]) 04/06/2018  . Pressure injury of skin 10/01/2016  . Sepsis ([place]) 09/29/2016  . Protein-calorie malnutrition, severe 09/29/2016  . Calculus of gallbladder without cholecystitis without obstruction    PCP:  Marguerita Merles, MD Pharmacy:   [id number] [place], [place] [place] [address] Phone: [phone] Fax: [phone]     Social Determinants of Health (SDOH) Interventions    Readmission Risk Interventions 30 Day Unplanned Readmission Risk Score     ED to Hosp-Admission (Current) from 04/17/2018 in [place] (1C)  30 Day Unplanned Readmission Risk Score (%)  16 Filed at 04/18/2018 0801     This score is the patient's risk of an unplanned readmission within 30 days of being discharged (0 -100%). The score is based on dignosis, age, lab data, medications, orders, and past utilization.   Low:  0-14.9   Medium: 15-21.9   High: 22-29.9   Extreme: 30 and above       No flowsheet data found.

## 2018-04-18 NOTE — Discharge Summary (Signed)
[facility] [place] NAME: Roberta Ramos    MR#:  [id number]  DATE OF BIRTH:  12-14-1960  DATE OF ADMISSION:  04/17/2018   ADMITTING PHYSICIAN: Harrie Foreman, MD  DATE OF DISCHARGE: 04/18/2018  2:00 PM  PRIMARY CARE PHYSICIAN: Marguerita Merles, MD   ADMISSION DIAGNOSIS:  Dehydration [E86.0] Hypernatremia [E87.0] Sepsis ([place]) [A41.9] Abdominal pain, unspecified abdominal location [R10.9] Fever, unspecified fever cause [R50.9] Constipation, unspecified constipation type [K59.00] Hematuria, unspecified type [R31.9] DISCHARGE DIAGNOSIS:  Active Problems:   Protein-calorie malnutrition, severe   Constipation   Abdominal pain  SECONDARY DIAGNOSIS:   Past Medical History:  Diagnosis Date  . Arthritis   . Cerebral palsy ([place])   . COPD (chronic obstructive pulmonary disease) ([place])   . Seizures [facility])    HOSPITAL COURSE:   The patient is a 58 year old female with past medical history of cerebral palsy, epilepsy, and failure to thrive who presented to the emergency department due to fever. She was recently discharged from the hospital due to ruled out UTI with sepsis on 04/16/18. Prior to that she was recently discharged for pneumonia. Most recent discharge summary by Dr. Manuella Ghazi indicates that she has poor prognosis, suspected appropriate for hospice, palliative care needed, however family was not on board.  04/18/18 she returns with diffuse abdominal pain as well as indications of sepsis on admission. The patient was given fluid in the emergency department as well as antibiotics prior to the hospital service being called for admission.   1.  Sepsis, resolved: The patient met criteria via tachycardia and tachypnea.  She was hemodynamically stable.  Source appeared to be urine.  Continued antibiotics. IVF. Follow blood cultures and urine cultures for growth and sensitivities:  Lactate WNL this admission, UA negative for infection, blood  culture no growth <12 hrs, urine culture in process. WBC WNL, patient afebrile. When seen she appeared non-toxic. CT with evidence of heavy colonic stool burden, likely contributing to abdominal pain. - Given lack of evidence of infection, clinical improvement on the day of discharge (per sister "she had a pretty good night), Dr. Manuella Ghazi recommended discharge home with [facility] services and bowel regimen. Especially given global risk of COVID-19 infection/in the hospital at this time. Again, we feel she would benefit from outpatient palliative care.  2.  Hypokalemia: Repleted potassium prior to discharge. Patient tolerates oral potassium without reaction. Followup with PCP outpatient.   3.  Epilepsy: Stable.  Resume Dilantin     4.  Failure to thrive: BMI is 9.8; encourage p.o. intake. Outpatient palliative care referral placed.  5.  Constipation: May be contributing to abdominal pain.  Lactulose daily. Miralax. Colace. Sister/caretaker expressed concerns about loose stools if using bowel regimen, reiterated importance of regular bowel movements and maintaining bowel regimen.  6.  DVT prophylaxis: SCDs while admitted  7.  GI prophylaxis: None  8. Hematuria Endorsed hematuria in the ED. ED MD ordered CT renal stone study showed no renal or ureteral stones. No hydronephrosis.   9. Hypernatremia Secondary to dehydration, IVF. Encouraged PO fluid intake. 149 on day of discharge, not much changed from range from previous recent admissions. appropriate to discharge home per Dr. Manuella Ghazi. Follow-up with PCP.   DISCHARGE CONDITIONS:  stable CONSULTS OBTAINED:   DRUG ALLERGIES:   Allergies  Allergen Reactions  . Potassium-Containing Compounds Hives and Swelling    With IV products only, reviewed with family 04/15/18  . Dairy Aid [Lactase] Other (See  Comments)    Stomach pains and constipation   DISCHARGE MEDICATIONS:   Allergies as of 04/18/2018      Reactions   Potassium-containing Compounds Hives,  Swelling   With IV products only, reviewed with family 04/15/18   Dairy Aid [lactase] Other (See Comments)   Stomach pains and constipation      Medication List    TAKE these medications   albuterol (2.5 MG/3ML) 0.083% nebulizer solution Commonly known as:  PROVENTIL Take 2.5 mg by nebulization every 4 (four) hours as needed for wheezing or shortness of breath.   budesonide 0.5 MG/2ML nebulizer solution Commonly known as:  PULMICORT Take 0.5 mg by nebulization 2 (two) times daily.   diazepam 5 MG tablet Commonly known as:  VALIUM Take 5 mg by mouth 3 (three) times daily as needed for anxiety or muscle spasms.   docusate sodium 100 MG capsule Commonly known as:  COLACE Take 1 capsule (100 mg total) by mouth 2 (two) times daily for 30 days.   ipratropium 0.02 % nebulizer solution Commonly known as:  ATROVENT Take 0.5 mg by nebulization every 4 (four) hours as needed for wheezing or shortness of breath.   lactulose 10 GM/15ML solution Commonly known as:  CHRONULAC Take 30 mLs (20 g total) by mouth daily for 30 days.   loratadine 5 MG/5ML syrup Commonly known as:  CLARITIN Take 5 mg by mouth at bedtime.   mupirocin ointment 2 % Commonly known as:  BACTROBAN APPLY 3 TIMES A DAY TO AFFECTED AREA   phenytoin 125 MG/5ML suspension Commonly known as:  DILANTIN Take 3.5 mLs (87.5 mg total) by mouth 2 (two) times daily. Please do not take dose for evening of March 6 and morning of March 7 then resume dose   polyethylene glycol packet Commonly known as:  MIRALAX / GLYCOLAX Take 17 g by mouth daily as needed for mild constipation or moderate constipation.   ranitidine 150 MG/10ML syrup Commonly known as:  ZANTAC Take 112.5 mg by mouth 2 (two) times daily.   simethicone 80 MG chewable tablet Commonly known as:  MYLICON Chew 80 mg by mouth as needed for flatulence.        DISCHARGE INSTRUCTIONS:   DIET:  Cardiac diet DISCHARGE CONDITION:  Fair ACTIVITY:  Activity  as tolerated OXYGEN:  Home Oxygen: No.  Oxygen Delivery: room air DISCHARGE LOCATION:  home with [facility] RN 2 week protocol, open with [facility]. Outpatient palliative care referral  If you experience worsening of your admission symptoms, develop shortness of breath, life threatening emergency, suicidal or homicidal thoughts you must seek medical attention immediately by calling 911 or calling your MD immediately if your symptoms are severe.  You Must read complete instructions/literature along with all the possible adverse reactions/side effects for all the medicines you take and that have been prescribed to you. Take any new medicines only after you have completely understood and accept all the possible adverse reactions/side effects.   Please note  You were cared for by a hospitalist during your hospital stay. If you have any questions about your discharge medications or the care you received while you were in the hospital after you are discharged, you can call the unit and asked to speak with the hospitalist on call if the hospitalist that took care of you is not available. Once you are discharged, your primary care physician will handle any further medical issues. Please note that NO REFILLS for any discharge medications will be authorized once you are  discharged, as it is imperative that you return to your primary care physician (or establish a relationship with a primary care physician if you do not have one) for your aftercare needs so that they can reassess your need for medications and monitor your lab values.    On the day of Discharge:  VITAL SIGNS:  Blood pressure 114/68, pulse 93, temperature 98.3 F (36.8 C), temperature source Axillary, resp. rate 18, height _0  (1.626 m), weight 26 kg, SpO2 99 %. PHYSICAL EXAMINATION:  GENERAL: 58 y.o.-year-old patient lying in the bed, making eye contact but nonverbal. Does not wince when moved. EYES: Pupils equal, round, reactive to light and  accommodation. No scleral icterus. Extraocular muscles intact.  HEENT: Head atraumatic, normocephalic. Oropharynx and nasopharynx clear.  NECK: Supple, no jugular venous distention. No thyroid enlargement, no tenderness.  LUNGS: Normal breath sounds bilaterally, no wheezing, rales or crepitation. No use of accessory muscles of respiration.Decreased bibasilar breath sounds. Occasional rhonchi heard at the bases CARDIOVASCULAR: S1, S2 normal. No murmurs, rubs, or gallops.  ABDOMEN: Soft, nontender, nondistended. Bowel sounds not able to auscultate. No organomegaly or mass.  EXTREMITIES:Contracted, atrophic lower extremities.Marland Kitchen  NEUROLOGIC:Bedbound since 50 months old, completely dependent for all activities. Atrophic extremities, not following commands. Making eye contact. Nonverbal.Nonambulatory. PSYCHIATRIC: The patient is alert  SKIN:Bandage intact over lesion photographed from last admission:   L shoulder, x 1 month. Reaction to IV potassium per sister. DATA REVIEW:   CBC Recent Labs  Lab 04/17/18 1912  WBC 8.2  HGB 12.1  HCT 35.1*  PLT 278    Chemistries  Recent Labs  Lab 04/13/18 2035  04/17/18 1912  NA  --    < > 149*  K  --    < > 3.3*  CL  --    < > 117*  CO2  --    < > 21*  GLUCOSE  --    < > 102*  BUN  --    < > 13  CREATININE  --    < > 0.36*  CALCIUM  --    < > 8.6*  MG 2.2  --   --   AST  --   --  40  ALT  --   --  29  ALKPHOS  --   --  121  BILITOT  --   --  0.5   < > = values in this interval not displayed.     Microbiology Results  Results for orders placed or performed during the hospital encounter of 04/17/18  Culture, blood (Routine x 2)     Status: None (Preliminary result)   Collection Time: 04/17/18  7:12 PM  Result Value Ref Range Status   Specimen Description BLOOD BLOOD RIGHT HAND  Final   Special Requests   Final    BOTTLES DRAWN AEROBIC AND ANAEROBIC Blood Culture results may not be optimal due to an inadequate volume of blood  received in culture bottles   Culture   Final    NO GROWTH < 12 HOURS Performed at [facility], [address] [place],  [address]    Report Status PENDING  Incomplete  Culture, blood (Routine x 2)     Status: None (Preliminary result)   Collection Time: 04/17/18  7:12 PM  Result Value Ref Range Status   Specimen Description BLOOD BLOOD RIGHT FOREARM  Final   Special Requests   Final    BOTTLES DRAWN AEROBIC AND ANAEROBIC Blood Culture results  may not be optimal due to an inadequate volume of blood received in culture bottles   Culture   Final    NO GROWTH < 12 HOURS Performed at [facility], [address] [place], [place] [address]    Report Status PENDING  Incomplete    RADIOLOGY:  Ct Renal Stone Study  Result Date: 04/17/2018 CLINICAL DATA:  Hematuria EXAM: CT ABDOMEN AND PELVIS WITHOUT CONTRAST TECHNIQUE: Multidetector CT imaging of the abdomen and pelvis was performed following the standard protocol without IV contrast. COMPARISON:  09/28/2016 FINDINGS: Lower chest: No acute abnormality Hepatobiliary: No focal hepatic abnormality. Gallbladder unremarkable. Pancreas: No focal abnormality or ductal dilatation. Spleen: No focal abnormality.  Normal size. Adrenals/Urinary Tract: No renal or ureteral stones. No renal or adrenal mass. No hydronephrosis. Urinary bladder decompressed, Stomach/Bowel: Severely increased stool burden throughout the colon. Large amount of stool in the rectosigmoid colon. Cannot exclude fecal impaction. Stomach and small bowel decompressed. Vascular/Lymphatic: No evidence of aneurysm or adenopathy. Reproductive: Uterus and adnexa unremarkable.  No mass. Other: No free fluid or free air. Musculoskeletal: Severe rightward thoracolumbar scoliosis. No acute bony abnormality. IMPRESSION: Severely increased stool burden throughout the colon. Cannot exclude fecal impaction. No renal or ureteral stones.  No hydronephrosis. Electronically Signed    By: Rolm Baptise M.D.   On: 04/17/2018 20:53     Management plans discussed with the patient and/or family and they are in agreement.  CODE STATUS: Prior   TOTAL TIME TAKING CARE OF THIS PATIENT: 45 minutes.    Lothar Prehn PA-C on 04/18/2018 at 8:09 PM  Between 7am to 6pm - Pager - [phone]  After 6pm go to [email or web address] - password [email or web address] [place] [facility]  Office  [phone]  CC: Primary care physician; Marguerita Merles, MD

## 2018-04-19 LAB — PHENYTOIN LEVEL, FREE AND TOTAL
PHENYTOIN, TOTAL: 6.8 ug/mL — AB (ref 10.0–20.0)
Phenytoin, Free: 0.6 ug/mL — ABNORMAL LOW (ref 1.0–2.0)
Phenytoin, Free: 0.5 ug/mL — ABNORMAL LOW (ref 1.0–2.0)
Phenytoin, Total: 7.7 ug/mL — ABNORMAL LOW (ref 10.0–20.0)

## 2018-04-19 LAB — URINE CULTURE: Culture: NO GROWTH

## 2018-04-22 LAB — CULTURE, BLOOD (ROUTINE X 2)
Culture: NO GROWTH
Culture: NO GROWTH

## 2018-04-29 ENCOUNTER — Inpatient Hospital Stay: Payer: Medicare Other

## 2018-04-29 ENCOUNTER — Inpatient Hospital Stay
Admission: EM | Admit: 2018-04-29 | Discharge: 2018-04-30 | DRG: 176 | Disposition: A | Discharge status: Home Health | Payer: Medicare Other | Attending: Internal Medicine | Admitting: Internal Medicine

## 2018-04-29 ENCOUNTER — Emergency Department: Payer: Medicare Other

## 2018-04-29 ENCOUNTER — Other Ambulatory Visit: Payer: Self-pay

## 2018-04-29 DIAGNOSIS — E870 Hyperosmolality and hypernatremia: Secondary | ICD-10-CM | POA: Diagnosis present

## 2018-04-29 DIAGNOSIS — K5900 Constipation, unspecified: Secondary | ICD-10-CM | POA: Diagnosis not present

## 2018-04-29 DIAGNOSIS — Z888 Allergy status to other drugs, medicaments and biological substances status: Secondary | ICD-10-CM

## 2018-04-29 DIAGNOSIS — G809 Cerebral palsy, unspecified: Secondary | ICD-10-CM | POA: Diagnosis present

## 2018-04-29 DIAGNOSIS — J449 Chronic obstructive pulmonary disease, unspecified: Secondary | ICD-10-CM | POA: Diagnosis present

## 2018-04-29 DIAGNOSIS — D473 Essential (hemorrhagic) thrombocythemia: Secondary | ICD-10-CM | POA: Diagnosis present

## 2018-04-29 DIAGNOSIS — G40909 Epilepsy, unspecified, not intractable, without status epilepticus: Secondary | ICD-10-CM | POA: Diagnosis not present

## 2018-04-29 DIAGNOSIS — Z7401 Bed confinement status: Secondary | ICD-10-CM

## 2018-04-29 DIAGNOSIS — Z7951 Long term (current) use of inhaled steroids: Secondary | ICD-10-CM | POA: Diagnosis not present

## 2018-04-29 DIAGNOSIS — Z681 Body mass index (BMI) 19 or less, adult: Secondary | ICD-10-CM | POA: Diagnosis not present

## 2018-04-29 DIAGNOSIS — Z79899 Other long term (current) drug therapy: Secondary | ICD-10-CM | POA: Diagnosis not present

## 2018-04-29 DIAGNOSIS — E872 Acidosis: Secondary | ICD-10-CM | POA: Diagnosis not present

## 2018-04-29 DIAGNOSIS — R627 Adult failure to thrive: Secondary | ICD-10-CM | POA: Diagnosis present

## 2018-04-29 DIAGNOSIS — R509 Fever, unspecified: Secondary | ICD-10-CM | POA: Diagnosis present

## 2018-04-29 DIAGNOSIS — I2699 Other pulmonary embolism without acute cor pulmonale: Principal | ICD-10-CM | POA: Diagnosis present

## 2018-04-29 DIAGNOSIS — Z8619 Personal history of other infectious and parasitic diseases: Secondary | ICD-10-CM

## 2018-04-29 DIAGNOSIS — A419 Sepsis, unspecified organism: Secondary | ICD-10-CM | POA: Diagnosis present

## 2018-04-29 DIAGNOSIS — R109 Unspecified abdominal pain: Secondary | ICD-10-CM

## 2018-04-29 LAB — PROTIME-INR
INR: 1.2 (ref 0.8–1.2)
Prothrombin Time: 15.3 seconds — ABNORMAL HIGH (ref 11.4–15.2)

## 2018-04-29 LAB — CBC WITH DIFFERENTIAL/PLATELET
Abs Immature Granulocytes: 0.05 10*3/uL (ref 0.00–0.07)
Basophils Absolute: 0 10*3/uL (ref 0.0–0.1)
Basophils Relative: 0 %
Eosinophils Absolute: 0.1 10*3/uL (ref 0.0–0.5)
Eosinophils Relative: 1 %
HCT: 41.7 % (ref 36.0–46.0)
Hemoglobin: 14.1 g/dL (ref 12.0–15.0)
Immature Granulocytes: 0 %
LYMPHS PCT: 17 %
Lymphs Abs: 2 10*3/uL (ref 0.7–4.0)
MCH: 30.9 pg (ref 26.0–34.0)
MCHC: 33.8 g/dL (ref 30.0–36.0)
MCV: 91.4 fL (ref 80.0–100.0)
Monocytes Absolute: 0.9 10*3/uL (ref 0.1–1.0)
Monocytes Relative: 8 %
Neutro Abs: 8.4 10*3/uL — ABNORMAL HIGH (ref 1.7–7.7)
Neutrophils Relative %: 74 %
Platelets: 589 10*3/uL — ABNORMAL HIGH (ref 150–400)
RBC: 4.56 MIL/uL (ref 3.87–5.11)
RDW: 18 % — ABNORMAL HIGH (ref 11.5–15.5)
WBC: 11.4 10*3/uL — AB (ref 4.0–10.5)
nRBC: 0 % (ref 0.0–0.2)

## 2018-04-29 LAB — LACTIC ACID, PLASMA
Lactic Acid, Venous: 4.9 mmol/L (ref 0.5–1.9)
Lactic Acid, Venous: 3.1 mmol/L (ref 0.5–1.9)

## 2018-04-29 LAB — COMPREHENSIVE METABOLIC PANEL
ALK PHOS: 172 U/L — AB (ref 38–126)
ALT: 19 U/L (ref 0–44)
ANION GAP: 14 (ref 5–15)
AST: 34 U/L (ref 15–41)
Albumin: 4.2 g/dL (ref 3.5–5.0)
BUN: 14 mg/dL (ref 6–20)
CO2: 24 mmol/L (ref 22–32)
Calcium: 9.1 mg/dL (ref 8.9–10.3)
Chloride: 114 mmol/L — ABNORMAL HIGH (ref 98–111)
Creatinine, Ser: 0.67 mg/dL (ref 0.44–1.00)
GFR calc Af Amer: >60 mL/min (ref >60)
GFR calc non Af Amer: >60 mL/min (ref >60)
GLUCOSE: 85 mg/dL (ref 70–99)
Potassium: 4.2 mmol/L (ref 3.5–5.1)
Sodium: 152 mmol/L — ABNORMAL HIGH (ref 135–145)
Total Bilirubin: 0.6 mg/dL (ref 0.3–1.2)
Total Protein: 9.1 g/dL — ABNORMAL HIGH (ref 6.5–8.1)

## 2018-04-29 LAB — URINALYSIS, COMPLETE (UACMP) WITH MICROSCOPIC
BILIRUBIN URINE: NEGATIVE
Bacteria, UA: NONE SEEN
Glucose, UA: NEGATIVE mg/dL
Hgb urine dipstick: NEGATIVE
KETONES UR: NEGATIVE mg/dL
Leukocytes,Ua: NEGATIVE
Nitrite: NEGATIVE
Protein, ur: 30 mg/dL — AB
SPECIFIC GRAVITY, URINE: 1.032 — AB (ref 1.005–1.030)
pH: 6 (ref 5.0–8.0)

## 2018-04-29 LAB — INFLUENZA PANEL BY PCR (TYPE A & B)
Influenza A By PCR: NEGATIVE
Influenza B By PCR: NEGATIVE

## 2018-04-29 LAB — APTT: aPTT: 36 seconds (ref 24–36)

## 2018-04-29 MED ORDER — BUDESONIDE 0.5 MG/2ML IN SUSP
0.5000 mg | Freq: Two times a day (BID) | RESPIRATORY_TRACT | Status: DC
  Administered 2018-04-30: 08:00:00 0.5 mg via RESPIRATORY_TRACT
  Filled 2018-04-29 (×3): qty 2

## 2018-04-29 MED ORDER — MORPHINE SULFATE (PF) 2 MG/ML IV SOLN
1.0000 mg | INTRAVENOUS | Status: DC | PRN
  Administered 2018-04-29 – 2018-04-30 (×3): 1 mg via INTRAVENOUS
  Filled 2018-04-29 (×3): qty 1

## 2018-04-29 MED ORDER — ENOXAPARIN SODIUM 80 MG/0.8ML ~~LOC~~ SOLN
25.0000 mg | SUBCUTANEOUS | Status: DC
  Administered 2018-04-29: 23:00:00 25 mg via SUBCUTANEOUS
  Filled 2018-04-29: qty 0.8

## 2018-04-29 MED ORDER — LORAZEPAM 2 MG/ML PO CONC
0.5000 mg | Freq: Two times a day (BID) | ORAL | Status: DC
  Administered 2018-04-29 – 2018-04-30 (×2): 0.5 mg via ORAL
  Filled 2018-04-29 (×2): qty 1

## 2018-04-29 MED ORDER — SODIUM CHLORIDE 0.9 % IV BOLUS
1000.0000 mL | Freq: Once | INTRAVENOUS | Status: AC
  Administered 2018-04-29: 1000 mL via INTRAVENOUS

## 2018-04-29 MED ORDER — ENOXAPARIN SODIUM 300 MG/3ML IJ SOLN
1.0000 mg/kg | INTRAMUSCULAR | Status: DC
  Filled 2018-04-29: qty 0.23

## 2018-04-29 MED ORDER — PHENYTOIN 125 MG/5ML PO SUSP
87.5000 mg | Freq: Two times a day (BID) | ORAL | Status: DC
  Administered 2018-04-29 – 2018-04-30 (×2): 87.5 mg via ORAL
  Filled 2018-04-29 (×3): qty 4

## 2018-04-29 MED ORDER — RANITIDINE HCL 150 MG/10ML PO SYRP
112.5000 mg | ORAL_SOLUTION | Freq: Two times a day (BID) | ORAL | Status: DC
  Administered 2018-04-29 – 2018-04-30 (×2): 112.5 mg via ORAL
  Filled 2018-04-29 (×3): qty 10

## 2018-04-29 MED ORDER — VANCOMYCIN HCL IN DEXTROSE 1-5 GM/200ML-% IV SOLN: 1000.0000 mg | Freq: Once | INTRAVENOUS | Status: DC

## 2018-04-29 MED ORDER — IPRATROPIUM BROMIDE 0.02 % IN SOLN: 0.5000 mg | RESPIRATORY_TRACT | Status: DC | PRN

## 2018-04-29 MED ORDER — ONDANSETRON HCL 4 MG/2ML IJ SOLN
4.0000 mg | Freq: Once | INTRAMUSCULAR | Status: AC
  Administered 2018-04-29: 4 mg via INTRAVENOUS
  Filled 2018-04-29: qty 2

## 2018-04-29 MED ORDER — SIMETHICONE 80 MG PO CHEW
80.0000 mg | CHEWABLE_TABLET | ORAL | Status: DC | PRN
  Filled 2018-04-29: qty 1

## 2018-04-29 MED ORDER — SODIUM CHLORIDE 0.9 % IV SOLN
1.0000 g | Freq: Once | INTRAVENOUS | Status: AC
  Administered 2018-04-29: 1 g via INTRAVENOUS
  Filled 2018-04-29: qty 1

## 2018-04-29 MED ORDER — VANCOMYCIN HCL 500 MG IV SOLR
500.0000 mg | Freq: Once | INTRAVENOUS | Status: AC
  Administered 2018-04-29: 500 mg via INTRAVENOUS
  Filled 2018-04-29: qty 500

## 2018-04-29 MED ORDER — DOCUSATE SODIUM 100 MG PO CAPS
100.0000 mg | ORAL_CAPSULE | Freq: Two times a day (BID) | ORAL | Status: DC
  Filled 2018-04-29: qty 1

## 2018-04-29 MED ORDER — LACTULOSE 10 GM/15ML PO SOLN
20.0000 g | Freq: Every day | ORAL | Status: DC
  Filled 2018-04-29: qty 30

## 2018-04-29 MED ORDER — MUPIROCIN 2 % EX OINT
TOPICAL_OINTMENT | Freq: Every day | CUTANEOUS | Status: DC
  Filled 2018-04-29: qty 22

## 2018-04-29 MED ORDER — IOHEXOL 350 MG/ML SOLN
50.0000 mL | Freq: Once | INTRAVENOUS | Status: AC | PRN
  Administered 2018-04-29: 21:00:00 50 mL via INTRAVENOUS

## 2018-04-29 MED ORDER — FENTANYL CITRATE (PF) 100 MCG/2ML IJ SOLN
12.5000 ug | Freq: Once | INTRAMUSCULAR | Status: AC
  Administered 2018-04-29: 12.5 ug via INTRAVENOUS
  Filled 2018-04-29: qty 2

## 2018-04-29 MED ORDER — SODIUM CHLORIDE 0.9 % IV SOLN
1.0000 g | INTRAVENOUS | Status: DC
  Filled 2018-04-29: qty 1

## 2018-04-29 MED ORDER — LORATADINE 5 MG/5ML PO SYRP
5.0000 mg | ORAL_SOLUTION | Freq: Every day | ORAL | Status: DC
  Administered 2018-04-29: 5 mg via ORAL
  Filled 2018-04-29 (×2): qty 5

## 2018-04-29 MED ORDER — ALBUTEROL SULFATE (2.5 MG/3ML) 0.083% IN NEBU: 2.5000 mg | INHALATION_SOLUTION | RESPIRATORY_TRACT | Status: DC | PRN

## 2018-04-29 MED ORDER — SODIUM CHLORIDE 0.45 % IV SOLN
INTRAVENOUS | Status: DC
  Administered 2018-04-29: 23:00:00 via INTRAVENOUS

## 2018-04-29 MED ORDER — POLYETHYLENE GLYCOL 3350 17 G PO PACK: 17.0000 g | PACK | Freq: Every day | ORAL | Status: DC | PRN

## 2018-04-29 NOTE — Consult Note (Signed)
CODE SEPSIS - PHARMACY COMMUNICATION  **Broad Spectrum Antibiotics should be administered within 1 hour of Sepsis diagnosis**  Time Code Sepsis Called/Page Received: 1752  Antibiotics Ordered: Vancomycin/Cefepime  Time of 1st antibiotic administration: 1829  Additional action taken by pharmacy: Called nurse @ [phone]  If necessary, Name of Provider/Nurse Contacted: Hinda Glatter ,PharmD Clinical Pharmacist  04/29/2018  5:53 PM

## 2018-04-29 NOTE — ED Notes (Signed)
hospitalist in to see pt.

## 2018-04-29 NOTE — ED Notes (Signed)
2 sets of cultures obtained. L posterior fa Kadijah NT and R upper arm IT sales professional.

## 2018-04-29 NOTE — ED Notes (Signed)
Report from georgie,rn.  

## 2018-04-29 NOTE — ED Notes (Signed)
Tried to capture 2nd EKG but pt will not stop moving.

## 2018-04-29 NOTE — ED Provider Notes (Signed)
[facility] Emergency Department Provider Note  ____________________________________________  Time seen: Approximately 5:48 PM  I have reviewed the triage vital signs and the nursing notes.   HISTORY  Chief Complaint Fever and Failure To Thrive  Level 5 caveat:  Portions of the history and physical were unable to be obtained due to non verbal   HPI Roberta Ramos is a 58 y.o. female with a history of cerebral palsy and 3 admissions just this month for sepsis (from UTI pneumonia) who presents for evaluation of fever and decreased oral intake.  According to caretaker patient has had a low grade fever since she was discharged home 11 days ago. Over the last few days patient seems to be in pain and is always moaning and groaning which is note typical for her. Has not been eating or drinking.  No cough, no vomiting, no diarrhea  Past Medical History:  Diagnosis Date  . Arthritis   . Cerebral palsy ([place])   . COPD (chronic obstructive pulmonary disease) ([place])   . Seizures [facility])     Patient Active Problem List   Diagnosis Date Noted  . Sepsis ([place]) 04/29/2018  . Constipation 04/18/2018  . Abdominal pain 04/18/2018  . Sepsis secondary to UTI ([place]) 04/13/2018  . Aspiration pneumonia ([place]) 04/06/2018  . Pressure injury of skin 10/01/2016  . Protein-calorie malnutrition, severe 09/29/2016  . Calculus of gallbladder without cholecystitis without obstruction     Past Surgical History:  Procedure Laterality Date  . NO PAST SURGERIES    . none      Prior to Admission medications   Medication Sig Start Date End Date Taking? Authorizing Provider  albuterol (PROVENTIL) (2.5 MG/3ML) 0.083% nebulizer solution Take 2.5 mg by nebulization every 4 (four) hours as needed for wheezing or shortness of breath.     [provider]  budesonide (PULMICORT) 0.5 MG/2ML nebulizer solution Take 0.5 mg by nebulization 2 (two) times daily.    [provider]   diazepam (VALIUM) 5 MG tablet Take 5 mg by mouth 3 (three) times daily as needed for anxiety or muscle spasms.    [provider]  docusate sodium (COLACE) 100 MG capsule Take 1 capsule (100 mg total) by mouth 2 (two) times daily for 30 days. 04/18/18 05/18/18  Lule, Sara Chu, PA  ipratropium (ATROVENT) 0.02 % nebulizer solution Take 0.5 mg by nebulization every 4 (four) hours as needed for wheezing or shortness of breath.     [provider]  lactulose (CHRONULAC) 10 GM/15ML solution Take 30 mLs (20 g total) by mouth daily for 30 days. 04/18/18 05/18/18  Ripley Fraise, PA  loratadine (CLARITIN) 5 MG/5ML syrup Take 5 mg by mouth at bedtime.    [provider]  mupirocin ointment (BACTROBAN) 2 % APPLY 3 TIMES A DAY TO AFFECTED AREA 04/06/18   [provider]  phenytoin (DILANTIN) 125 MG/5ML suspension Take 3.5 mLs (87.5 mg total) by mouth 2 (two) times daily. Please do not take dose for evening of March 6 and morning of March 7 then resume dose 04/07/18   [name], MD  polyethylene glycol (MIRALAX / GLYCOLAX) packet Take 17 g by mouth daily as needed for mild constipation or moderate constipation.     [provider]  ranitidine (ZANTAC) 150 MG/10ML syrup Take 112.5 mg by mouth 2 (two) times daily.    [provider]  simethicone (MYLICON) 80 MG chewable tablet Chew 80 mg by mouth as needed for flatulence.  [provider]    Allergies Potassium-containing compounds and Dairy aid [lactase]  Family History  Problem Relation Age of Onset  . Hypertension Mother   . Uterine cancer Mother   . Diabetes Mellitus II Brother   . Bone cancer Father     Social History Social History   Tobacco Use  . Smoking status: Never Smoker  . Smokeless tobacco: Never Used  Substance Use Topics  . Alcohol use: No  . Drug use: No    Review of Systems  Constitutional: + fever. Respiratory: Negative for shortness of breath or cough Gastrointestinal:  Negative for vomiting or diarrhea. Skin: Negative for rash.  ____________________________________________   PHYSICAL EXAM:  VITAL SIGNS: ED Triage Vitals  Enc Vitals Group     BP --      Pulse Rate 04/29/18 1703 (!) 154     Resp 04/29/18 1703 (!) 29     Temp 04/29/18 1708 99.5 F (37.5 C)     Temp Source 04/29/18 1708 Axillary     SpO2 04/29/18 1703 95 %     Weight 04/29/18 1709 50 lb (22.7 kg)     Height 04/29/18 1709 4' (1.219 m)     Head Circumference --      Peak Flow --      Pain Score --      Pain Loc --      Pain Edu? --      Excl. in [place]? --     Constitutional: Awake, non verbal, does not look in distress. HEENT:      Head: Normocephalic and atraumatic.         Eyes: Conjunctivae are normal. Sclera is non-icteric.       Mouth/Throat: Mucous membranes are dry.       Neck: Supple with no signs of meningismus. Cardiovascular: Tachycardic with regular rhythm.  Respiratory: Tachypneic, normal sat, no wheezes or crackles Gastrointestinal: Soft, non tender, and non distended with positive bowel sounds. No rebound or guarding. Genitourinary: No decubitus ulcer Musculoskeletal:  No edema, cyanosis, or erythema of extremities. Neurologic:  Face is symmetric. Moving all extremities.  Skin: Skin is warm, dry and intact. No rash noted.  ____________________________________________   LABS (all labs ordered are listed, but only abnormal results are displayed)  Labs Reviewed  CBC WITH DIFFERENTIAL/PLATELET - Abnormal; Notable for the following components:      Result Value   WBC 11.4 (*)    RDW 18.0 (*)    Platelets 589 (*)    Neutro Abs 8.4 (*)    All other components within normal limits  COMPREHENSIVE METABOLIC PANEL - Abnormal; Notable for the following components:   Sodium 152 (*)    Chloride 114 (*)    Total Protein 9.1 (*)    Alkaline Phosphatase 172 (*)    All other components within normal limits  URINALYSIS, COMPLETE (UACMP) WITH MICROSCOPIC - Abnormal;  Notable for the following components:   Color, Urine YELLOW (*)    APPearance CLEAR (*)    Specific Gravity, Urine 1.032 (*)    Protein, ur 30 (*)    All other components within normal limits  LACTIC ACID, PLASMA - Abnormal; Notable for the following components:   Lactic Acid, Venous 4.9 (*)    All other components within normal limits  LACTIC ACID, PLASMA - Abnormal; Notable for the following components:   Lactic Acid, Venous 3.1 (*)    All other components within normal limits  CULTURE, BLOOD (ROUTINE X 2)  CULTURE, BLOOD (ROUTINE X 2)  INFLUENZA PANEL BY PCR (TYPE A & B)  URINALYSIS, ROUTINE W REFLEX MICROSCOPIC  CREATININE, SERUM   ____________________________________________  EKG  ED ECG REPORT I, Rudene Re, the attending physician, personally viewed and interpreted this ECG.  Interpretation is limited due to interference from patient constant moving. Sinus tachycardia, rate of 159, no ST elevations or depressions.  Unchanged from prior. ____________________________________________  RADIOLOGY  I have personally reviewed the images performed during this visit and I agree with the Radiologist's read.   Interpretation by Radiologist:  Dg Chest Portable 1 View  Result Date: 04/29/2018 CLINICAL DATA:  Fever. EXAM: PORTABLE CHEST 1 VIEW COMPARISON:  April 17, 2018 FINDINGS: The study is limited due to patient positioning. No pneumothorax. The cardiomediastinal silhouette is stable. The right lung is clear. Mild increase lung markings on the left similar to the April 13, 2018 study. No focal infiltrate. IMPRESSION: No acute abnormality identified. Electronically Signed   By: Dorise Bullion III M.D   On: 04/29/2018 18:56      ____________________________________________   PROCEDURES  Procedure(s) performed: none  Procedures Critical Care performed:  Yes  CRITICAL CARE Performed by: Rudene Re  ?  Total critical care time: 30 min  Critical care  time was exclusive of separately billable procedures and treating other patients.  Critical care was necessary to treat or prevent imminent or life-threatening deterioration.  Critical care was time spent personally by me on the following activities: development of treatment plan with patient and/or surrogate as well as nursing, discussions with consultants, evaluation of patient's response to treatment, examination of patient, obtaining history from patient or surrogate, ordering and performing treatments and interventions, ordering and review of laboratory studies, ordering and review of radiographic studies, pulse oximetry and re-evaluation of patient's condition.  ____________________________________________   INITIAL IMPRESSION / ASSESSMENT AND PLAN / ED COURSE  58 y.o. female with a history of cerebral palsy and 3 admissions just this month for sepsis (from UTI pneumonia) who presents for evaluation of fever and decreased oral intake.  Patient with a low-grade temp of 99.5 axillary, tachycardic with a pulse in the 150s, no hypoxia, abdomen is soft and patient does not seem tender to palpation.  Presentation concerning for severe dehydration versus sepsis from UTI or pneumonia or flu. Will give IVF, get labs, UA, CXR.    _________________________ 8:08 PM on 04/29/2018 -----------------------------------------  Patient with elevated lactic of 4.9.  Unclear source of infection versus severe dehydration.  She was covered broadly with cefepime and Vanco, given IV fluids.  Discussed with Dr. Bonita Quin for admission   As part of my medical decision making, I reviewed the following data within the [place] History obtained from family, Nursing notes reviewed and incorporated, Labs reviewed , EKG interpreted , Old chart reviewed, Radiograph reviewed , Discussed with admitting physician , Notes from prior ED visits and [place] Controlled Substance Database    Pertinent labs & imaging  results that were available during my care of the patient were reviewed by me and considered in my medical decision making (see chart for details).    ____________________________________________   FINAL CLINICAL IMPRESSION(S) / ED DIAGNOSES  Final diagnoses:  Sepsis, due to unspecified organism, unspecified whether acute organ dysfunction present [facility])      NEW MEDICATIONS STARTED DURING THIS VISIT:  ED Discharge Orders    None       Note:  This document was prepared using Dragon voice recognition software and  may include unintentional dictation errors.    Alfred Levins, [place], MD 04/29/18 2011

## 2018-04-29 NOTE — H&P (Signed)
[facility] [place] NAME: Roberta Ramos    MR#:  [id number]  DATE OF BIRTH:  [birth year]  DATE OF ADMISSION:  04/29/2018  PRIMARY CARE PHYSICIAN: Marguerita Merles, MD   REQUESTING/REFERRING PHYSICIAN: Dr Gonzella Lex  CHIEF COMPLAINT:   Chief Complaint  Patient presents with  . Fever  . Failure To Thrive    HISTORY OF PRESENT ILLNESS:  Roberta Ramos  is a 58 y.o. female with a known history of cerebral palsy and nonverbal.  She presents to the hospital with fever and not eating very well.  She has had 3 recent hospitalizations including one for aspiration pneumonia, 1 for urinary tract infection and 1 4 fecal impaction.  The patient's sister is at the bedside.  She is able to provide history and help out trying to interpret what her sister may be feeling.  She believes her sister is in pain.  PAST MEDICAL HISTORY:   Past Medical History:  Diagnosis Date  . Arthritis   . Cerebral palsy ([place])   . COPD (chronic obstructive pulmonary disease) ([place])   . Seizures ([place])     PAST SURGICAL HISTORY:   Past Surgical History:  Procedure Laterality Date  . NO PAST SURGERIES    . none      SOCIAL HISTORY:   Social History   Tobacco Use  . Smoking status: Never Smoker  . Smokeless tobacco: Never Used  Substance Use Topics  . Alcohol use: No    FAMILY HISTORY:   Family History  Problem Relation Age of Onset  . Hypertension Mother   . Uterine cancer Mother   . Diabetes Mellitus II Brother   . Bone cancer Father     DRUG ALLERGIES:   Allergies  Allergen Reactions  . Potassium-Containing Compounds Hives and Swelling    With IV products only, reviewed with family 04/15/18  . Dairy Aid [Lactase] Other (See Comments)    Stomach pains and constipation    REVIEW OF SYSTEMS:  Patient is nonverbal and unable to provide review of systems.  MEDICATIONS AT HOME:   Prior to Admission medications   Medication Sig  Start Date End Date Taking? Authorizing Provider  albuterol (PROVENTIL) (2.5 MG/3ML) 0.083% nebulizer solution Take 2.5 mg by nebulization every 4 (four) hours as needed for wheezing or shortness of breath.     [provider]  budesonide (PULMICORT) 0.5 MG/2ML nebulizer solution Take 0.5 mg by nebulization 2 (two) times daily.    [provider]  diazepam (VALIUM) 5 MG tablet Take 5 mg by mouth 3 (three) times daily as needed for anxiety or muscle spasms.    [provider]  docusate sodium (COLACE) 100 MG capsule Take 1 capsule (100 mg total) by mouth 2 (two) times daily for 30 days. 04/18/18 05/18/18  Lule, Sara Chu, PA  ipratropium (ATROVENT) 0.02 % nebulizer solution Take 0.5 mg by nebulization every 4 (four) hours as needed for wheezing or shortness of breath.     [provider]  lactulose (CHRONULAC) 10 GM/15ML solution Take 30 mLs (20 g total) by mouth daily for 30 days. 04/18/18 05/18/18  Ripley Fraise, PA  loratadine (CLARITIN) 5 MG/5ML syrup Take 5 mg by mouth at bedtime.    [provider]  mupirocin ointment (BACTROBAN) 2 % APPLY 3 TIMES A DAY TO AFFECTED AREA 04/06/18   [provider]  phenytoin (DILANTIN) 125 MG/5ML suspension Take 3.5 mLs (87.5 mg total) by mouth 2 (two)  times daily. Please do not take dose for evening of March 6 and morning of March 7 then resume dose 04/07/18   [name], MD  polyethylene glycol (MIRALAX / GLYCOLAX) packet Take 17 g by mouth daily as needed for mild constipation or moderate constipation.     [provider]  ranitidine (ZANTAC) 150 MG/10ML syrup Take 112.5 mg by mouth 2 (two) times daily.    [provider]  simethicone (MYLICON) 80 MG chewable tablet Chew 80 mg by mouth as needed for flatulence.    [provider]      VITAL SIGNS:  Blood pressure (!) 121/101, pulse (!) 139, temperature 99.8 F (37.7 C), temperature source Rectal, resp. rate (!) 30, height 4' (1.219 m),  weight 22.7 kg, SpO2 96 %.  PHYSICAL EXAMINATION:  GENERAL:  58 y.o.-year-old patient lying in the bed with some distress EYES: Pupils equal, round, reactive to light and accommodation. HEENT: Head atraumatic, normocephalic.  Unable to look into mouth NECK:  Supple, no jugular venous distention. No thyroid enlargement, no tenderness.  LUNGS: Decreased breath sounds bilaterally, no wheezing, rales,rhonchi or crepitation.  Positive use of accessory muscles of respiration.  Positive for tachypnea CARDIOVASCULAR: S1, S2 tachycardic. No murmurs, rubs, or gallops.  ABDOMEN: Soft, tender, nondistended. Bowel sounds present. No organomegaly or mass.  EXTREMITIES: No pedal edema, cyanosis, or clubbing.  Looks like some contractures on the left arm NEUROLOGIC: Cranial nerves II through XII are intact. Muscle strength 5/5 in all extremities. Sensation intact. Gait not checked.  PSYCHIATRIC: The patient is alert.  SKIN: No rash, lesion, or ulcer.   LABORATORY PANEL:   CBC Recent Labs  Lab 04/29/18 1717  WBC 11.4*  HGB 14.1  HCT 41.7  PLT 589*   ------------------------------------------------------------------------------------------------------------------  Chemistries  Recent Labs  Lab 04/29/18 1717  NA 152*  K 4.2  CL 114*  CO2 24  GLUCOSE 85  BUN 14  CREATININE 0.67  CALCIUM 9.1  AST 34  ALT 19  ALKPHOS 172*  BILITOT 0.6   ------------------------------------------------------------------------------------------------------------------    RADIOLOGY:  Dg Chest Portable 1 View  Result Date: 04/29/2018 CLINICAL DATA:  Fever. EXAM: PORTABLE CHEST 1 VIEW COMPARISON:  April 17, 2018 FINDINGS: The study is limited due to patient positioning. No pneumothorax. The cardiomediastinal silhouette is stable. The right lung is clear. Mild increase lung markings on the left similar to the April 13, 2018 study. No focal infiltrate. IMPRESSION: No acute abnormality identified.  Electronically Signed   By: Dorise Bullion III M.D   On: 04/29/2018 18:56    EKG:   EKG does have a lot of interference on the monitor looks like sinus tachycardia.  EKG hard to tell if it was sinus or atrial flutter 159 bpm  IMPRESSION AND PLAN:   1.  Clinical sepsis unclear cause.  Patient with fever, tachycardia, leukocytosis.  Lactic acidosis.  IV fluid hydration flu swab pending.  Patient does not have any risk factors for Covid-19 since she is home with family and family is not sick.  We will give empiric antibiotics and follow-up cultures. 2.  Tachypnea.  We will get a CT Angie of the chest to rule out pulmonary embolism 3.  Constipation.  Continue anticonstipation medications. 4.  Failure to thrive.  Will get swallow evaluation.  Put on dysphasia diet for now. 5.  Hypernatremia.  Will put on half-normal saline and hydrate. 6.  Thrombocytosis.  Likely an acute phase reactant with lactic acidosis and fever 7.  We  will get ultrasound of the right upper quadrant to look at the abdomen tomorrow morning to rule out gallbladder as a cause of the patient's fever. 8.  History of seizure.  On Dilantin 9.  History of cerebral palsy   All the records are reviewed and case discussed with ED provider. Management plans discussed with the patient, family and they are in agreement.  CODE STATUS: Full code  TOTAL TIME TAKING CARE OF THIS PATIENT: 45 minutes, including ACP time.    Loletha Grayer M.D on 04/29/2018 at 8:18 PM  Between 7am to 6pm - Pager - [phone]  After 6pm call admission pager [phone]  [facility]  [phone]  CC: Primary care physician; Marguerita Merles, MD

## 2018-04-29 NOTE — ED Notes (Signed)
Roberta Ramos NT at bedside to assist in collecting cultures and urine sample by I&O cath.

## 2018-04-29 NOTE — ED Notes (Signed)
EDP Veronese notified in person of Lactic 4.9.

## 2018-04-29 NOTE — ED Notes (Signed)
ED TO INPATIENT HANDOFF REPORT  ED Nurse Name and Phone #: Collie Wernick [phone]   S Name/Age/Gender Roberta Ramos 58 y.o. female Room/Bed: ED11A/ED11A  Code Status   Code Status: Prior  Home/SNF/Other Home Patient oriented to: situation Is this baseline? Yes   Triage Complete: Triage complete  Chief Complaint fever, AMS  Triage Note Pt presents to ED via POV from home. Caregiver states that patient has had fever at home, highest at home 100.2, reports pt is not eating. Caregiver reports that patient has been in increased pain, pt noted to be moaning repeatedly. Reports patient has been seen several times over the last month.    Allergies Allergies  Allergen Reactions  . Potassium-Containing Compounds Hives and Swelling    With IV products only, reviewed with family 04/15/18  . Dairy Aid [Lactase] Other (See Comments)    Stomach pains and constipation    Level of Care/Admitting Diagnosis ED Disposition    ED Disposition Condition [facility] Area: [place] [[id number]]  Level of Care: Med-Surg [16]  Diagnosis: Sepsis [facility]) [[id number]]  Admitting Physician: Loletha Grayer [[id number]]  Attending Physician: Loletha Grayer [phone]  Estimated length of stay: past midnight tomorrow  Certification:: I certify this patient will need inpatient services for at least 2 midnights  PT Class (Do Not Modify): Inpatient [101]  PT Acc Code (Do Not Modify): Private [1]       B Medical/Surgery History Past Medical History:  Diagnosis Date  . Arthritis   . Cerebral palsy ([place])   . COPD (chronic obstructive pulmonary disease) ([place])   . Seizures ([place])    Past Surgical History:  Procedure Laterality Date  . NO PAST SURGERIES    . none       A IV Location/Drains/Wounds Patient Lines/Drains/Airways Status   Active Line/Drains/Airways    Name:   Placement date:   Placement time:   Site:   Days:   Peripheral IV 04/17/18 Right Forearm   04/17/18     1855    Forearm   12   Peripheral IV 04/17/18 Right Hand   04/17/18    1928    Hand   12   Peripheral IV 04/29/18 Right Forearm   04/29/18    1712    Forearm   less than 1   Peripheral IV 04/29/18 Right Arm   04/29/18    1803    Arm   less than 1   Pressure Injury Healing pressure area   -    -               Intake/Output Last 24 hours  Intake/Output Summary (Last 24 hours) at 04/29/2018 1954 Last data filed at 04/29/2018 1920 Gross per 24 hour  Intake 1000 ml  Output -  Net 1000 ml    Labs/Imaging Results for orders placed or performed during the hospital encounter of 04/29/18 (from the past 48 hour(s))  CBC with Differential/Platelet     Status: Abnormal   Collection Time: 04/29/18  5:17 PM  Result Value Ref Range   WBC 11.4 (H) 4.0 - 10.5 K/uL   RBC 4.56 3.87 - 5.11 MIL/uL   Hemoglobin 14.1 12.0 - 15.0 g/dL   HCT 41.7 36.0 - 46.0 %   MCV 91.4 80.0 - 100.0 fL   MCH 30.9 26.0 - 34.0 pg   MCHC 33.8 30.0 - 36.0 g/dL   RDW 18.0 (H) 11.5 - 15.5 %   Platelets 589 (H)  150 - 400 K/uL   nRBC 0.0 0.0 - 0.2 %   Neutrophils Relative % 74 %   Neutro Abs 8.4 (H) 1.7 - 7.7 K/uL   Lymphocytes Relative 17 %   Lymphs Abs 2.0 0.7 - 4.0 K/uL   Monocytes Relative 8 %   Monocytes Absolute 0.9 0.1 - 1.0 K/uL   Eosinophils Relative 1 %   Eosinophils Absolute 0.1 0.0 - 0.5 K/uL   Basophils Relative 0 %   Basophils Absolute 0.0 0.0 - 0.1 K/uL   Immature Granulocytes 0 %   Abs Immature Granulocytes 0.05 0.00 - 0.07 K/uL    Comment: Performed at [facility], [address] [place], [place] [address]  Comprehensive metabolic panel     Status: Abnormal   Collection Time: 04/29/18  5:17 PM  Result Value Ref Range   Sodium 152 (H) 135 - 145 mmol/L   Potassium 4.2 3.5 - 5.1 mmol/L   Chloride 114 (H) 98 - 111 mmol/L   CO2 24 22 - 32 mmol/L   Glucose, Bld 85 70 - 99 mg/dL   BUN 14 6 - 20 mg/dL   Creatinine, Ser 0.67 0.44 - 1.00 mg/dL   Calcium 9.1 8.9 - 10.3 mg/dL   Total Protein  9.1 (H) 6.5 - 8.1 g/dL   Albumin 4.2 3.5 - 5.0 g/dL   AST 34 15 - 41 U/L   ALT 19 0 - 44 U/L   Alkaline Phosphatase 172 (H) 38 - 126 U/L   Total Bilirubin 0.6 0.3 - 1.2 mg/dL   GFR calc non Af Amer >60 >60 mL/min   GFR calc Af Amer >60 >60 mL/min   Anion gap 14 5 - 15    Comment: Performed at [facility], [address] [place], [place] [address]  Urinalysis, Complete w Microscopic     Status: Abnormal   Collection Time: 04/29/18  5:17 PM  Result Value Ref Range   Color, Urine YELLOW (A) YELLOW   APPearance CLEAR (A) CLEAR   Specific Gravity, Urine 1.032 (H) 1.005 - 1.030   pH 6.0 5.0 - 8.0   Glucose, UA NEGATIVE NEGATIVE mg/dL   Hgb urine dipstick NEGATIVE NEGATIVE   Bilirubin Urine NEGATIVE NEGATIVE   Ketones, ur NEGATIVE NEGATIVE mg/dL   Protein, ur 30 (A) NEGATIVE mg/dL   Nitrite NEGATIVE NEGATIVE   Leukocytes,Ua NEGATIVE NEGATIVE   RBC / HPF 0-5 0 - 5 RBC/hpf   WBC, UA 0-5 0 - 5 WBC/hpf   Bacteria, UA NONE SEEN NONE SEEN   Squamous Epithelial / LPF 0-5 0 - 5   Mucus PRESENT     Comment: Performed at [facility], [address] [place], [place] [address]  Lactic acid, plasma     Status: Abnormal   Collection Time: 04/29/18  5:17 PM  Result Value Ref Range   Lactic Acid, Venous 4.9 (HH) 0.5 - 1.9 mmol/L    Comment: CRITICAL RESULT CALLED TO, READ BACK BY AND VERIFIED WITH GEORGIE HAHLE AT 1745 04/29/2018.PMF Performed at [facility], [address] [place], [place] [address]   Lactic acid, plasma     Status: Abnormal   Collection Time: 04/29/18  7:14 PM  Result Value Ref Range   Lactic Acid, Venous 3.1 (HH) 0.5 - 1.9 mmol/L    Comment: CRITICAL RESULT CALLED TO, READ BACK BY AND VERIFIED WITH STEVEN JONES ON 04/29/18 AT 1941 JAG Performed at [facility], [address] [place], [place] [address]  Dg Chest Portable 1 View  Result Date: 04/29/2018 CLINICAL DATA:  Fever. EXAM: PORTABLE CHEST 1 VIEW COMPARISON:  April 17, 2018 FINDINGS: The study is limited due to patient positioning. No pneumothorax. The cardiomediastinal silhouette is stable. The right lung is clear. Mild increase lung markings on the left similar to the April 13, 2018 study. No focal infiltrate. IMPRESSION: No acute abnormality identified. Electronically Signed   By: Dorise Bullion III M.D   On: 04/29/2018 18:56    Pending Labs Unresulted Labs (From admission, onward)    Start     Ordered   04/30/18 0500  Creatinine, serum  Tomorrow morning,   STAT     04/29/18 1949   04/29/18 1905  Influenza panel by PCR (type A & B)  (Influenza PCR Panel)  Once,   STAT     04/29/18 1904   04/29/18 1747  Urinalysis, Routine w reflex microscopic  ONCE - STAT,   STAT     04/29/18 1746   04/29/18 1737  Blood culture (routine x 2)  BLOOD CULTURE X 2,   STAT     04/29/18 1736          Vitals/Pain Today's Vitals   04/29/18 1830 04/29/18 1830 04/29/18 1852 04/29/18 1900  BP: (!) 148/69     Pulse:      Resp: (!) 27  (!) 23 (!) 28  Temp:  99.8 F (37.7 C)    TempSrc:  Rectal    SpO2:      Weight:      Height:        Isolation Precautions Droplet precaution  Medications Medications  vancomycin (VANCOCIN) 500 mg in sodium chloride 0.9 % 100 mL IVPB (500 mg Intravenous New Bag/Given 04/29/18 1913)  morphine 2 MG/ML injection 1 mg (has no administration in time range)  0.45 % sodium chloride infusion (has no administration in time range)  docusate sodium (COLACE) capsule 100 mg (has no administration in time range)  lactulose (CHRONULAC) 10 GM/15ML solution 20 g (has no administration in time range)  polyethylene glycol (MIRALAX / GLYCOLAX) packet 17 g (has no administration in time range)  ranitidine (ZANTAC) 150 MG/10ML syrup 112.5 mg (has no administration in time range)  simethicone (MYLICON) chewable tablet 80 mg (has no administration in time range)  phenytoin (DILANTIN) 125 MG/5ML suspension 87.5 mg (has no administration in time range)   albuterol (PROVENTIL) (2.5 MG/3ML) 0.083% nebulizer solution 2.5 mg (has no administration in time range)  budesonide (PULMICORT) nebulizer solution 0.5 mg (has no administration in time range)  ipratropium (ATROVENT) nebulizer solution 0.5 mg (has no administration in time range)  loratadine (CLARITIN) 5 MG/5ML syrup 5 mg (has no administration in time range)  mupirocin ointment (BACTROBAN) 2 % (has no administration in time range)  LORazepam (ATIVAN) 2 MG/ML concentrated solution 0.5 mg (has no administration in time range)  ceFEPIme (MAXIPIME) 1 g in sodium chloride 0.9 % 100 mL IVPB (has no administration in time range)  sodium chloride 0.9 % bolus 1,000 mL (0 mLs Intravenous Stopped 04/29/18 1920)  fentaNYL (SUBLIMAZE) injection 12.5 mcg (12.5 mcg Intravenous Given 04/29/18 1758)  ondansetron (ZOFRAN) injection 4 mg (4 mg Intravenous Given 04/29/18 1756)  ceFEPIme (MAXIPIME) 1 g in sodium chloride 0.9 % 100 mL IVPB (0 g Intravenous Stopped 04/29/18 1901)    Mobility non-ambulatory Moderate fall risk   Focused Assessments Cardiac Assessment Handoff:  Cardiac Rhythm: Sinus tachycardia Lab Results  Component Value Date   TROPONINI <0.03  04/13/2018   No results found for: DDIMER Does the Patient currently have chest pain? No     R Recommendations: See Admitting Provider Note  Report given to:   Additional Notes: pt with cerebral palsy, sister takes care of at home, sister at bedside.

## 2018-04-29 NOTE — ED Notes (Signed)
Date and time results received: 04/29/18 7:44 PM (use smartphrase ".now" to insert current time)  Test: Lactic Acid Critical Value: 3.1  Name of Provider Notified: Dr. Leslye Peer  Orders Received? Or Actions Taken?: No new orders at this time.

## 2018-04-29 NOTE — Progress Notes (Signed)
ANTICOAGULATION CONSULT NOTE - Initial Consult  Pharmacy Consult for enoxaparin Indication: pulmonary embolus  Allergies  Allergen Reactions  . Potassium-Containing Compounds Hives and Swelling    With IV products only, reviewed with family 04/15/18  . Dairy Aid [Lactase] Other (See Comments)    Stomach pains and constipation    Patient Measurements: Height: 4' (121.9 cm) Weight: 50 lb (22.7 kg) IBW/kg (Calculated) : 17.9 Heparin Dosing Weight: 23 kg  Vital Signs: Temp: 98.8 F (37.1 C) (03/28 2022) Temp Source: Axillary (03/28 2022) BP: 120/89 (03/28 2022) Pulse Rate: 129 (03/28 2022)  Labs: Recent Labs    04/29/18 1717  HGB 14.1  HCT 41.7  PLT 589*  CREATININE 0.67    Estimated Creatinine Clearance: 24.3 mL/min (by C-G formula based on SCr of 0.67 mg/dL).   Medical History: Past Medical History:  Diagnosis Date  . Arthritis   . Cerebral palsy ([place])   . COPD (chronic obstructive pulmonary disease) ([place])   . Seizures ([facility])     Medications:  Scheduled:  . budesonide  0.5 mg Nebulization BID  . docusate sodium  100 mg Oral BID  . enoxaparin (LOVENOX) injection  1 mg/kg Subcutaneous Q24H  . [START ON 04/30/2018] lactulose  20 g Oral Daily  . loratadine  5 mg Oral QHS  . LORazepam  0.5 mg Oral BID  . [START ON 04/30/2018] mupirocin ointment   Topical Daily  . phenytoin  87.5 mg Oral BID  . ranitidine  112.5 mg Oral BID    Assessment: Patient admitted for fever and failure to thrive. Admitted for sepsis source still unclear w/ PE on CT chest, clot burden apparently is small. Patient is not on any PTA anticoagulation.  Goal of Therapy:  Anti-Xa level: 0.6 - 1 units/mL Monitor platelets by anticoagulation protocol: Yes   Plan:  Will start patient on lovenox 1 mg/kg q24h based on a CrCl < 30 ml/min Will give enoxaparin 25 mg subq daily. Baseline aPTT and PT/INR ordered, baseline CBC WNL Will check an anti-Xa level since patient is at an extreme of weight  and to monitor for potential accumulation. Will check anti-Xa w/ am labs 03/30.  Tobie Lords, PharmD, BCPS Clinical Pharmacist 04/29/2018

## 2018-04-29 NOTE — ED Notes (Signed)
EKG completed

## 2018-04-29 NOTE — Consult Note (Signed)
Pharmacy Antibiotic Note  Roberta Ramos is a 58 y.o. female admitted on 04/29/2018 with sepsis.  Pharmacy has been consulted for Cefepime dosing.  Patient is 4' tall, with total weight = 22.7kg  Plan: Will dose cefepime 1g q24hours and monitor renal function carefully  Height: 4' (121.9 cm) Weight: 50 lb (22.7 kg) IBW/kg (Calculated) : 17.9  Temp (24hrs), Avg:99.7 F (37.6 C), Min:99.5 F (37.5 C), Max:99.8 F (37.7 C)  Recent Labs  Lab 04/29/18 1717  WBC 11.4*  CREATININE 0.67  LATICACIDVEN 4.9*    Estimated Creatinine Clearance: 24.3 mL/min (by C-G formula based on SCr of 0.67 mg/dL).    Allergies  Allergen Reactions  . Potassium-Containing Compounds Hives and Swelling    With IV products only, reviewed with family 04/15/18  . Dairy Aid [Lactase] Other (See Comments)    Stomach pains and constipation    Antimicrobials this admission: 3/28 Vancomycin 500mg  IV x 1  3/28 Cefepime >>   Dose adjustments this admission: None  Microbiology results: 3/28 BCx: pending  Thank you for allowing pharmacy to be a part of this patient's care.  Lu Duffel, PharmD, BCPS Clinical Pharmacist 04/29/2018 7:48 PM

## 2018-04-29 NOTE — ED Triage Notes (Addendum)
Pt presents to ED via POV from home. Caregiver states that patient has had fever at home, highest at home 100.2, reports pt is not eating. Caregiver reports that patient has been in increased pain, pt noted to be moaning repeatedly. Reports patient has been seen several times over the last month.

## 2018-04-29 NOTE — Progress Notes (Signed)
Patient ID: Roberta Ramos, female   DOB: Jun 01, 1960, 58 y.o.   MRN: [id number]  ACP note  Patient unable to participate in conversation.  Sister at the bedside  Diagnosis: Sepsis unclear cause, tachypnea, constipation, failure to thrive, hypernatremia, thrombocytosis, history of seizure, history of cerebral palsy bedbound and nonverbal.  CODE STATUS discussed.  Patient sister wanted the patient a full code.  Patient coming in for the fourth time to the hospital with fever and pain.  With repeated hospitalizations mortality goes up.  So for unclear etiology of sepsis.  Empiric antibiotics given.  Pain medication given.  CT angiogram of the chest to rule out pulmonary embolism and/or pneumonia.  Flu swab pending.  Time spent on ACP discussion 17 minutes Dr Loletha Grayer

## 2018-04-29 NOTE — ED Notes (Signed)
Still unable to get valid BP on arm or leg bc pt keeps moving despite education and family's comfort.

## 2018-04-29 NOTE — Significant Event (Signed)
CTA chest is positive for small bilateral pulmonary embolism.  Will start patient on Lovenox full dose with pharmacy consult for dosing.  Patient is hemodynamically stable.

## 2018-04-29 NOTE — ED Notes (Signed)
NT completing I&O cath now with this RN as witness.

## 2018-04-30 DIAGNOSIS — I2699 Other pulmonary embolism without acute cor pulmonale: Secondary | ICD-10-CM | POA: Diagnosis not present

## 2018-04-30 DIAGNOSIS — R509 Fever, unspecified: Secondary | ICD-10-CM | POA: Diagnosis not present

## 2018-04-30 LAB — CREATININE, SERUM
Creatinine, Ser: 0.4 mg/dL — ABNORMAL LOW (ref 0.44–1.00)
GFR calc Af Amer: >60 mL/min (ref >60)
GFR calc non Af Amer: >60 mL/min (ref >60)

## 2018-04-30 LAB — BASIC METABOLIC PANEL
Anion gap: 8 (ref 5–15)
BUN: 9 mg/dL (ref 6–20)
CO2: 20 mmol/L — AB (ref 22–32)
Calcium: 7.6 mg/dL — ABNORMAL LOW (ref 8.9–10.3)
Chloride: 117 mmol/L — ABNORMAL HIGH (ref 98–111)
Creatinine, Ser: 0.47 mg/dL (ref 0.44–1.00)
GFR calc Af Amer: >60 mL/min (ref >60)
GFR calc non Af Amer: >60 mL/min (ref >60)
Glucose, Bld: 104 mg/dL — ABNORMAL HIGH (ref 70–99)
Potassium: 2.2 mmol/L — CL (ref 3.5–5.1)
Sodium: 145 mmol/L (ref 135–145)

## 2018-04-30 MED ORDER — ENOXAPARIN SODIUM 300 MG/3ML IJ SOLN: 25.0000 mg | INTRAMUSCULAR | 0 refills | Status: AC

## 2018-04-30 MED ORDER — POTASSIUM CHLORIDE 20 MEQ/15ML (10%) PO SOLN
60.0000 meq | Freq: Once | ORAL | Status: AC
  Administered 2018-04-30: 60 meq via ORAL
  Filled 2018-04-30: qty 45

## 2018-04-30 MED ORDER — WARFARIN SODIUM 2 MG PO TABS
2.0000 mg | ORAL_TABLET | Freq: Once | ORAL | Status: AC
  Administered 2018-04-30: 10:00:00 2 mg via ORAL
  Filled 2018-04-30: qty 1

## 2018-04-30 MED ORDER — PANTOPRAZOLE SODIUM 40 MG PO TBEC
40.0000 mg | DELAYED_RELEASE_TABLET | Freq: Every day | ORAL | Status: DC
  Administered 2018-04-30: 10:00:00 40 mg via ORAL
  Filled 2018-04-30: qty 1

## 2018-04-30 MED ORDER — TRAMADOL HCL 50 MG PO TABS: 50.0000 mg | ORAL_TABLET | Freq: Four times a day (QID) | ORAL | 0 refills | Status: AC | PRN

## 2018-04-30 MED ORDER — ORAL CARE MOUTH RINSE: 15.0000 mL | Freq: Two times a day (BID) | OROMUCOSAL | Status: DC

## 2018-04-30 MED ORDER — WARFARIN SODIUM 2 MG PO TABS: 2.0000 mg | ORAL_TABLET | Freq: Every day | ORAL | 0 refills | Status: AC

## 2018-04-30 MED ORDER — ENOXAPARIN SODIUM 300 MG/3ML IJ SOLN
25.0000 mg | INTRAMUSCULAR | Status: DC
  Filled 2018-04-30: qty 0.25

## 2018-04-30 MED ORDER — WARFARIN - PHARMACIST DOSING INPATIENT: Freq: Every day | Status: DC

## 2018-04-30 MED ORDER — WARFARIN SODIUM 2 MG PO TABS: 2.0000 mg | ORAL_TABLET | Freq: Every day | ORAL | Status: DC

## 2018-04-30 NOTE — Progress Notes (Signed)
   04/30/18 1200  Clinical Encounter Type  Visited With Family;Patient not available  Visit Type Initial  Referral From Nurse   OR received to complete or update an Advanced Directive. Chaplain talked with the patient's sister who is her primary caregiver, via telephone. She indicated that they were not interested in completing the document and did not wish to have any information for later review. She expressed gratitude for the call.

## 2018-04-30 NOTE — Discharge Summary (Addendum)
[facility] [facility] [place] NAME: Roberta Ramos    MR#:  [id number]  DATE OF BIRTH:  1960-06-11  DATE OF ADMISSION:  04/29/2018 ADMITTING PHYSICIAN: Loletha Grayer, MD  DATE OF DISCHARGE: 04/30/2018  PRIMARY CARE PHYSICIAN: Marguerita Merles, MD    ADMISSION DIAGNOSIS:  Abdominal pain [R10.9] Sepsis, due to unspecified organism, unspecified whether acute organ dysfunction present [facility]) [A41.9]  DISCHARGE DIAGNOSIS:  Pulmonary emboli  SECONDARY DIAGNOSIS:   Past Medical History:  Diagnosis Date  . Arthritis   . Cerebral palsy ([place])   . COPD (chronic obstructive pulmonary disease) ([place])   . Seizures [facility])     HOSPITAL COURSE:    58 year old female with history of cerebral palsy and seizure disorder who presents to the emergency room due to fever.  1.  Pulmonary emboli: Patient symptoms of fever, tachycardia and leukocytosis were due to pulmonary emboli.  Sepsis has been ruled out. Chest x-ray was negative.  Blood cultures have been negative.  UA was negative. CT did show small acute PE.  She will be on Lovenox and Coumadin.  She will need daily INR checked by home health nurse and this will be sent to her primary care physician's office for adjustment of the Coumadin.  Lovenox must be discontinued once INR is therapeutic between 2-3.  2.  Seizure disorder: Patient will continue on Dilantin.  She will need free Dilantin checked on Tuesday while she is on the Coumadin it would be advised to also check Dilantin level periodically.  3.  History of cerebral palsy:  4.  Hyponatremia: This is from poor p.o. intake    DISCHARGE CONDITIONS AND DIET:   Stable condition on dysphagia 1 diet  CONSULTS OBTAINED:  Treatment Team:  [name], MD  DRUG ALLERGIES:   Allergies  Allergen Reactions  . Potassium-Containing Compounds Hives and Swelling    With IV products only, reviewed with family 04/15/18  . Dairy Aid [Lactase] Other (See  Comments)    Stomach pains and constipation    DISCHARGE MEDICATIONS:   Allergies as of 04/30/2018      Reactions   Potassium-containing Compounds Hives, Swelling   With IV products only, reviewed with family 04/15/18   Dairy Aid [lactase] Other (See Comments)   Stomach pains and constipation      Medication List    TAKE these medications   albuterol (2.5 MG/3ML) 0.083% nebulizer solution Commonly known as:  PROVENTIL Take 2.5 mg by nebulization every 4 (four) hours as needed for wheezing or shortness of breath.   budesonide 0.5 MG/2ML nebulizer solution Commonly known as:  PULMICORT Take 0.5 mg by nebulization 2 (two) times daily.   diazepam 5 MG tablet Commonly known as:  VALIUM Take 5 mg by mouth 3 (three) times daily as needed for anxiety or muscle spasms.   docusate sodium 100 MG capsule Commonly known as:  COLACE Take 1 capsule (100 mg total) by mouth 2 (two) times daily for 30 days.   enoxaparin 300 MG/3ML Soln injection Commonly known as:  LOVENOX Inject 0.25 mLs (25 mg total) into the skin daily for 5 days.   ipratropium 0.02 % nebulizer solution Commonly known as:  ATROVENT Take 0.5 mg by nebulization every 4 (four) hours as needed for wheezing or shortness of breath.   lactulose 10 GM/15ML solution Commonly known as:  CHRONULAC Take 30 mLs (20 g total) by mouth daily for 30 days.   loratadine 5 MG/5ML syrup Commonly known as:  CLARITIN Take 5 mg by mouth at bedtime.   mupirocin ointment 2 % Commonly known as:  BACTROBAN APPLY 3 TIMES A DAY TO AFFECTED AREA   phenytoin 125 MG/5ML suspension Commonly known as:  DILANTIN Take 3.5 mLs (87.5 mg total) by mouth 2 (two) times daily. Please do not take dose for evening of March 6 and morning of March 7 then resume dose   polyethylene glycol packet Commonly known as:  MIRALAX / GLYCOLAX Take 17 g by mouth daily as needed for mild constipation or moderate constipation.   ranitidine 150 MG/10ML  syrup Commonly known as:  ZANTAC Take 112.5 mg by mouth 2 (two) times daily.   simethicone 80 MG chewable tablet Commonly known as:  MYLICON Chew 80 mg by mouth as needed for flatulence.   traMADol 50 MG tablet Commonly known as:  Ultram Take 1 tablet (50 mg total) by mouth every 6 (six) hours as needed.   warfarin 2 MG tablet Commonly known as:  COUMADIN Take 1 tablet (2 mg total) by mouth daily at 6 PM. Start taking on:  May 01, 2018         Today   CHIEF COMPLAINT:   Sister is at bedside.  No acute events overnight   VITAL SIGNS:  Blood pressure 106/62, pulse (!) 105, temperature 98.6 F (37 C), temperature source Oral, resp. rate 20, height 4' (1.219 m), weight 22.7 kg, SpO2 98 %.   REVIEW OF SYSTEMS:  Review of Systems  Unable to perform ROS: Patient nonverbal     PHYSICAL EXAMINATION:  GENERAL:  58 y.o.-year-old patient lying in the bed with no acute distress. Cerebral palsy NECK:  Supple, no jugular venous distention. No thyroid enlargement, no tenderness.  LUNGS: Normal breath sounds bilaterally, no wheezing, rales,rhonchi  No use of accessory muscles of respiration.  CARDIOVASCULAR: S1, S2 normal. No murmurs, rubs, or gallops.  ABDOMEN: Soft, non-tender, non-distended. Bowel sounds present. No organomegaly or mass.  EXTREMITIES:contractures PSYCHIATRIC: The patient is alert nonverbal at baseline   SKIN: No obvious rash, lesion, or ulcer.   DATA REVIEW:   CBC Recent Labs  Lab 04/29/18 1717  WBC 11.4*  HGB 14.1  HCT 41.7  PLT 589*    Chemistries  Recent Labs  Lab 04/29/18 1717  04/30/18 1108  NA 152*  --  145  K 4.2  --  2.2*  CL 114*  --  117*  CO2 24  --  20*  GLUCOSE 85  --  104*  BUN 14  --  9  CREATININE 0.67   < > 0.47  CALCIUM 9.1  --  7.6*  AST 34  --   --   ALT 19  --   --   ALKPHOS 172*  --   --   BILITOT 0.6  --   --    < > = values in this interval not displayed.    Cardiac Enzymes No results for input(s):  TROPONINI in the last 168 hours.  Microbiology Results  @[email or web address] @  RADIOLOGY:  Ct Angio Chest Pe W Or Wo Contrast  Result Date: 04/29/2018 CLINICAL DATA:  Tachypnea. EXAM: CT ANGIOGRAPHY CHEST WITH CONTRAST TECHNIQUE: Multidetector CT imaging of the chest was performed using the standard protocol during bolus administration of intravenous contrast. Multiplanar CT image reconstructions and MIPs were obtained to evaluate the vascular anatomy. CONTRAST:  52mL OMNIPAQUE IOHEXOL 350 MG/ML SOLN COMPARISON:  Radiograph earlier this day. FINDINGS: Cardiovascular: Examination is positive for small bilateral pulmonary emboli  in the lower lobes. Small filling defects in the distal right lower lobe lobar branch. Subsegmental filling defects in the left lower lobe. Thromboembolic burden is small. No evidence of right heart strain, RV to LV ratio is less than 1. Severe scoliosis distorts normal anatomy. Thoracic aorta is normal in caliber. No aortic dissection. Heart is normal in size. No pericardial effusion. Mediastinum/Nodes: No enlarged mediastinal or hilar lymph nodes. Decompressed esophagus. No dominant thyroid nodule, thyroid gland not well seen. Lungs/Pleura: Low lung volumes. Mild left lower lobe bronchial thickening. No focal airspace disease or pleural effusion. No pulmonary edema. No evidence of pulmonary infarct. Upper Abdomen: Fluid-filled stomach is partially included. Musculoskeletal: Severe scoliotic curvature of the spine. Bell-shaped thorax consistent with cerebral palsy. There is paraspinal muscle atrophy. Review of the MIP images confirms the above findings. IMPRESSION: 1. Positive for small bilateral lower lobe pulmonary emboli. Thromboembolic burden is small. No right heart strain or pulmonary infarct. 2. Low lung volumes with left lower lobe bronchial thickening. 3. Severe scoliotic curvature of the spine. Critical Value/emergent results were called by telephone at the time of interpretation on  04/29/2018 at 9:51 pm to Dr. Posey Pronto, who verbally acknowledged these results. Electronically Signed   By: Keith Rake M.D.   On: 04/29/2018 21:54   Dg Chest Portable 1 View  Result Date: 04/29/2018 CLINICAL DATA:  Fever. EXAM: PORTABLE CHEST 1 VIEW COMPARISON:  April 17, 2018 FINDINGS: The study is limited due to patient positioning. No pneumothorax. The cardiomediastinal silhouette is stable. The right lung is clear. Mild increase lung markings on the left similar to the April 13, 2018 study. No focal infiltrate. IMPRESSION: No acute abnormality identified. Electronically Signed   By: Dorise Bullion III M.D   On: 04/29/2018 18:56   US Abdomen Limited Ruq  Result Date: 04/29/2018 CLINICAL DATA:  Abdominal pain. EXAM: ULTRASOUND ABDOMEN LIMITED RIGHT UPPER QUADRANT COMPARISON:  CT 04/20/2018, ultrasound 11/29/2016 FINDINGS: Gallbladder: Distended. No gallstones or wall thickening visualized. Previous seen sludge is no longer visualized. No sonographic Murphy sign noted by sonographer, nonverbal patient but no indications of pain. Common bile duct: Diameter: Obscured and not well-defined. No evidence of biliary dilatation. Liver: No focal lesion identified. Within normal limits in parenchymal echogenicity. Portal vein is patent on color Doppler imaging with normal direction of blood flow towards the liver. Technically challenging exam due to patient's contracture. IMPRESSION: 1. Mild gallbladder distention without stones or sludge. No sonographic findings of acute cholecystitis. 2. Normal sonographic appearance of the liver. Electronically Signed   By: Keith Rake M.D.   On: 04/29/2018 22:27      Allergies as of 04/30/2018      Reactions   Potassium-containing Compounds Hives, Swelling   With IV products only, reviewed with family 04/15/18   Dairy Aid [lactase] Other (See Comments)   Stomach pains and constipation      Medication List    TAKE these medications   albuterol (2.5 MG/3ML)  0.083% nebulizer solution Commonly known as:  PROVENTIL Take 2.5 mg by nebulization every 4 (four) hours as needed for wheezing or shortness of breath.   budesonide 0.5 MG/2ML nebulizer solution Commonly known as:  PULMICORT Take 0.5 mg by nebulization 2 (two) times daily.   diazepam 5 MG tablet Commonly known as:  VALIUM Take 5 mg by mouth 3 (three) times daily as needed for anxiety or muscle spasms.   docusate sodium 100 MG capsule Commonly known as:  COLACE Take 1 capsule (100 mg total) by  mouth 2 (two) times daily for 30 days.   enoxaparin 300 MG/3ML Soln injection Commonly known as:  LOVENOX Inject 0.25 mLs (25 mg total) into the skin daily for 5 days.   ipratropium 0.02 % nebulizer solution Commonly known as:  ATROVENT Take 0.5 mg by nebulization every 4 (four) hours as needed for wheezing or shortness of breath.   lactulose 10 GM/15ML solution Commonly known as:  CHRONULAC Take 30 mLs (20 g total) by mouth daily for 30 days.   loratadine 5 MG/5ML syrup Commonly known as:  CLARITIN Take 5 mg by mouth at bedtime.   mupirocin ointment 2 % Commonly known as:  BACTROBAN APPLY 3 TIMES A DAY TO AFFECTED AREA   phenytoin 125 MG/5ML suspension Commonly known as:  DILANTIN Take 3.5 mLs (87.5 mg total) by mouth 2 (two) times daily. Please do not take dose for evening of March 6 and morning of March 7 then resume dose   polyethylene glycol packet Commonly known as:  MIRALAX / GLYCOLAX Take 17 g by mouth daily as needed for mild constipation or moderate constipation.   ranitidine 150 MG/10ML syrup Commonly known as:  ZANTAC Take 112.5 mg by mouth 2 (two) times daily.   simethicone 80 MG chewable tablet Commonly known as:  MYLICON Chew 80 mg by mouth as needed for flatulence.   traMADol 50 MG tablet Commonly known as:  Ultram Take 1 tablet (50 mg total) by mouth every 6 (six) hours as needed.   warfarin 2 MG tablet Commonly known as:  COUMADIN Take 1 tablet (2 mg  total) by mouth daily at 6 PM. Start taking on:  May 01, 2018         Management plans discussed with the patient;s sister and she is in agreement. Stable for discharge   Patient should follow up with pcp  CODE STATUS:  Code Status History    Date Active Date Inactive Code Status Order ID Comments User Context   04/17/2018 2338 04/18/2018 1724 Full Code [id number]  Harrie Foreman, MD Inpatient   04/13/2018 2340 04/16/2018 1628 Full Code [id number]  Arta Silence, MD Inpatient   04/06/2018 1642 04/07/2018 2148 Full Code [id number]  Gorden Harms, MD Inpatient   09/29/2016 0539 10/04/2016 1632 Full Code [id number]  Harrie Foreman, MD Inpatient      TOTAL TIME TAKING CARE OF THIS PATIENT: 39 minutes.    Note: This dictation was prepared with Dragon dictation along with smaller phrase technology. Any transcriptional errors that result from this process are unintentional.  [name] M.D on 04/30/2018 at 11:40 AM  Between 7am to 6pm - Pager - [phone] After 6pm go to [email or web address] - password [email or web address] [facility]  Office  [phone]  CC: Primary care physician; Marguerita Merles, MD

## 2018-04-30 NOTE — Progress Notes (Signed)
Pt's niece present at the [place] entrance for discharge; pt discharged via wheelchair by nursing to the visitor's entrance

## 2018-04-30 NOTE — Discharge Instructions (Signed)
[facility]

## 2018-04-30 NOTE — Progress Notes (Signed)
[place] for warfarin dosing  Indication: pulmonary embolus  Allergies  Allergen Reactions  . Potassium-Containing Compounds Hives and Swelling    With IV products only, reviewed with family 04/15/18  . Dairy Aid [Lactase] Other (See Comments)    Stomach pains and constipation    Patient Measurements: Height: 4' (121.9 cm) Weight: 50 lb (22.7 kg) IBW/kg (Calculated) : 17.9 Heparin Dosing Weight: 23 kg  Vital Signs: Temp: 98.6 F (37 C) (03/29 0433) Temp Source: Oral (03/29 0433) BP: 106/62 (03/29 0433) Pulse Rate: 105 (03/29 0433)  Labs: Recent Labs    04/29/18 1717 04/29/18 2214 04/30/18 0449  HGB 14.1  --   --   HCT 41.7  --   --   PLT 589*  --   --   APTT  --  36  --   LABPROT  --  15.3*  --   INR  --  1.2  --   CREATININE 0.67  --  0.40*    Estimated Creatinine Clearance: 24.3 mL/min (A) (by C-G formula based on SCr of 0.4 mg/dL (L)).   Medical History: Past Medical History:  Diagnosis Date  . Arthritis   . Cerebral palsy ([place])   . COPD (chronic obstructive pulmonary disease) ([place])   . Seizures ([facility])     Medications:  Scheduled:  . budesonide  0.5 mg Nebulization BID  . docusate sodium  100 mg Oral BID  . enoxaparin (LOVENOX) injection  25 mg Subcutaneous Q24H  . lactulose  20 g Oral Daily  . loratadine  5 mg Oral QHS  . LORazepam  0.5 mg Oral BID  . mouth rinse  15 mL Mouth Rinse BID  . mupirocin ointment   Topical Daily  . pantoprazole  40 mg Oral Daily  . phenytoin  87.5 mg Oral BID  . ranitidine  112.5 mg Oral BID  . warfarin  2 mg Oral Once  . [START ON 05/01/2018] warfarin  2 mg Oral q1800  . Warfarin - Pharmacist Dosing Inpatient   Does not apply q1800    Assessment: Patient admitted for fever and failure to thrive. Admitted for sepsis source still unclear w/ PE on CT chest. Patient has history significant for CP and Seizure. Patient's outpatient medication regimen includes phenytoin. Patient would  be a poor candidate for alternative oral anticoagulation based on extremely low weight (50lbs), low creatinine clearance, and seizure history requiring phenytoin.   Goal of Therapy:  INR: 2-3 Anti-Xa level: 0.6 - 1 units/mL Monitor platelets by anticoagulation protocol: Yes   Plan:  In setting of phenytoin and low weight will initiate patient on warfarin 2mg  daily. Will plan to monitor INR daily. Patient may require much smaller dose for maintenance therapy. Recommend bridging with enoxaparin until patient has received 5 days of therapy and at least two INRs in therapeutic range.   Continue enoxparin 25mg  Q24hr. Will continue with plan to check anti-Xa level with am labs. Note this is a send out lab and is not performed in house at [facility].   Will order phenytoin level with am labs due to possible effects of warfarin on phenytoin. Will also obtain albumin.   Pharmacy will continue to monitor and adjust per consult.   MLS 04/30/2018

## 2018-04-30 NOTE — TOC Transition Note (Signed)
Transition of Care [facility]) - CM/SW Discharge Note   Patient Details  Name: Roberta Ramos MRN: [id number] Date of Birth: February 29, 1960  [facility]) CM/SW Contact:  Latanya Maudlin, RN Phone Number: 04/30/2018, 10:36 AM   Clinical Narrative:   Patient to be discharged per MD order. Orders in place for home health services. Patient is active with [facility] and family prefers to resume services. Notified Malachy Mood of pending discharge. Patient has DME hospital bed, lift, etc. Patients main caregiver is her sister. Family prefers to transport home.     Final next level of care: Home w Home Health Services Barriers to Discharge: No Barriers Identified   Patient Goals and CMS Choice   CMS Medicare.gov Compare Post Acute Care list provided to:: Patient Represenative (must comment) Choice offered to / list presented to : [employer] / [place]  Discharge Placement                       Discharge Plan and Services   Discharge Planning Services: CM Consult Post Acute Care Choice: [place]: [place]   Social Determinants of Health (SDOH) Interventions     Readmission Risk Interventions No flowsheet data found.

## 2018-04-30 NOTE — Progress Notes (Signed)
Pt has sister who is caregiver at bedside. Sister was told there is a visitor restrictions where no visitor is allowed. However, sister reported that family member has never been around anyone outside the family and she wont take any medication or food from anyone. Nursing supervisor is aware and came and spoke to sister and reported she could stay for the night but a follow-up decision could change in the morning. Sister is also told she needs to wear a mask every time she leaves the room. A mask was provided for sister. Pt report she cannot her sister and she the advocate for her because the pt is nonverbal.

## 2018-04-30 NOTE — Progress Notes (Signed)
MD order received in [facility] to discharge pt home with home health today; Case Management previously reestablished home health services with [facility]; verbally reviewed AVS with pt's sister, gave Rxs for Coumadin and Lovenox to pt, Dr Caryn Section e-scribed Ultram to the [facility] in [facility]; no questions voiced at this time; pt's discharge pending arrival of her niece as a ride at the [facility] entrance

## 2018-05-01 ENCOUNTER — Other Ambulatory Visit: Payer: Medicare Other | Admitting: Nurse Practitioner

## 2018-05-01 ENCOUNTER — Encounter: Payer: Self-pay | Admitting: Nurse Practitioner

## 2018-05-01 ENCOUNTER — Other Ambulatory Visit: Payer: Self-pay

## 2018-05-01 ENCOUNTER — Telehealth: Payer: Self-pay

## 2018-05-01 DIAGNOSIS — R630 Anorexia: Secondary | ICD-10-CM | POA: Insufficient documentation

## 2018-05-01 DIAGNOSIS — R5381 Other malaise: Secondary | ICD-10-CM | POA: Insufficient documentation

## 2018-05-01 DIAGNOSIS — Z515 Encounter for palliative care: Secondary | ICD-10-CM | POA: Insufficient documentation

## 2018-05-01 NOTE — Progress Notes (Signed)
[place] Consult Note Telephone: [phone]  Fax: [phone]  PATIENT NAME: Roberta Ramos DOB: [birth year] MRN: [id number]  PRIMARY CARE PROVIDER:   Marguerita Merles, MD  REFERRING PROVIDER:  Marguerita Merles, MD [address] [place], [place] [id number]  RESPONSIBLE PARTY:   Evi Mccomb sister  Due to the covid 19 crisis this visit was done via telemedicine from my office and it was initiated and consented by the family, sister Levada Dy as the patient is cognitively impaired.  RECOMMENDATIONS and PLAN:  1. Palliative care encounter Z51.5; Palliative medicine team will continue to support patient, patient's family, and medical team. Visit consisted of counseling and education dealing with the complex and emotionally intense issues of symptom management and palliative care in the setting of serious and potentially life-threatening illness  2. Anorexia R63.0 but appetite remaining declined. Continue to encourage supplements and comfort feedings. Discuss nutrition  3. Debility R53.81 secondary to cerebral palsy, FTT progressive, encourage passive rom;   ASSESSMENT:   I was able to Christie Nottingham, Ms [facility] sister. We talked about past medical history in the setting of chronic disease and progression. We talked about multiple recent hospitalizations and concern for overall decline with progression to end of life. We talked about home health nurse visits for PT/INR weekly. We talked about her overall clinical condition and now left lower foot is more swollen. We talked about medical goals of care including aggressive versus conservative versus comfort care. We talked about wishes for Ms. Zaccaro including DNR as currently she does remain a full code. Levada Dy talk with her sisters during palliative care visit and all were in agreement for do not resuscitate and they wish to keep her comfortable at home. They don't want her on a ventilator or  aggressive interventions. We talked about role of palliative care and plan of care. We talked about option of Hospice Services as she does appear that she would be eligible. Levada Dy endorses that she and her sisters would like for Ms. Lurie to have a hospice screening which was also recommended by Dr. Manuella Ghazi hospitalist at time of Hospital discharge. Therapeutic listening and emotional support provided. Contact information provided. Questions answered satisfaction  3 / 6 /2020 weight 28.4 kg 3 / 12 /2020 weight 26.8 kg 04/18/2018 weight 26kg  3 / 29 / 2020 weight 22.7 kg  3 / 28 / 2020 sodium 152, but has seemed 4.2, chloride 114, co2 24, calcium 9.1, bun 14, Creatinine 0.67, glucose 85, albumin 4.2, total protein 9.1, WBC 11.4, hemoglobin 14.1, hematocrit 41.7, platelets 589  04/30/2018 Sodium 145, potassium 2.2, chloride 117, CO2 20, calcium 7.6, bun 19, creatinine 0.47, glucose 104   I spent 60  minutes providing this consultation,  from 1:45pm to 2;45pm More than 50% of the time in this consultation was spent coordinating communication.   HISTORY OF PRESENT ILLNESS:  [name] is a 58 y.o. year old female with multiple medical problems including COPD, seizure disorder, cerebral palsy, arthritis, calculus of gallbladder, protein calorie malnutrition. Hospitalized 3/5 / 2020 to 3 / 6 / 2020 for aspiration pneumonia of right middle lobe, acute hypoxic respiratory failure. She was placed on dysphagia 1 diet after evaluation by speech for aspiration pneumonia. Hospitalized 3 / 12 / 2020 to 3 / 15 / 2024 upper respiratory tract infection, sinus tachycardia with hypotension, left shoulder lesion no sign of infection this is healing wound from IV potassium phlebitis. Sepsis was ruled out  and WBC was normal with negative procalcitonin. Her discussion family acceptable for hospice and keep her comfortable at home. Hospitalised 3 / 17 / 2020 diffuse abdominal pain with sepsis which resolved. She required  antibiotics and IV fluids with blood cultures in urine negative remained a febrile. CT with evidence of heavy colonic stool burden likely reason for abdominal pain. Resume Dilantin for epilepsy. Failure to thrive with BMI 9.6. Hypokalemia with potassium replaced. Hematuria with CT renal Stone showed no renal stones or hydronephrosis. Hypernatremia secondary to dehydration. Hospitalize 3 / 28 / 2020 to 3/ 29 / 2020 from fever, tachycardia, leukocytosis secondary to pulmonary emboli and sepsis has been ruled out. Blood cultures, urine and test extreme negative. CT did show a small acute PE and she was started on coumadin with Lovenox. Seizure disorder she continued to take Dilantin. She remained on dysphagia 1 diet. She was returned home. She has been bed-bound her entire life, [facility], and incontinence of bowel and bladder. She requires to be fed and appetite has continued to be poor. She is cognitively impaired. She is taken care of by her sisters. Palliative Care was asked to help address goals of care.   CODE STATUS: DNR  PPS: 20% HOSPICE ELIGIBILITY/DIAGNOSIS: appears <6 months and eligible  PAST MEDICAL HISTORY:  Past Medical History:  Diagnosis Date   Arthritis    Cerebral palsy ([place])    COPD (chronic obstructive pulmonary disease) ([place])    Seizures ([facility])     SOCIAL HX:  Social History   Tobacco Use   Smoking status: Never Smoker   Smokeless tobacco: Never Used  Substance Use Topics   Alcohol use: No    ALLERGIES:  Allergies  Allergen Reactions   Potassium-Containing Compounds Hives and Swelling    With IV products only, reviewed with family 04/15/18   Dairy Aid [Lactase] Other (See Comments)    Stomach pains and constipation     PERTINENT MEDICATIONS:  Outpatient Encounter Medications as of 05/01/2018  Medication Sig   albuterol (PROVENTIL) (2.5 MG/3ML) 0.083% nebulizer solution Take 2.5 mg by nebulization every 4 (four) hours as needed for wheezing or shortness  of breath.    budesonide (PULMICORT) 0.5 MG/2ML nebulizer solution Take 0.5 mg by nebulization 2 (two) times daily.   diazepam (VALIUM) 5 MG tablet Take 5 mg by mouth 3 (three) times daily as needed for anxiety or muscle spasms.   docusate sodium (COLACE) 100 MG capsule Take 1 capsule (100 mg total) by mouth 2 (two) times daily for 30 days.   enoxaparin (LOVENOX) 300 MG/3ML SOLN injection Inject 0.25 mLs (25 mg total) into the skin daily for 5 days.   ipratropium (ATROVENT) 0.02 % nebulizer solution Take 0.5 mg by nebulization every 4 (four) hours as needed for wheezing or shortness of breath.    lactulose (CHRONULAC) 10 GM/15ML solution Take 30 mLs (20 g total) by mouth daily for 30 days.   loratadine (CLARITIN) 5 MG/5ML syrup Take 5 mg by mouth at bedtime.   mupirocin ointment (BACTROBAN) 2 % APPLY 3 TIMES A DAY TO AFFECTED AREA   phenytoin (DILANTIN) 125 MG/5ML suspension Take 3.5 mLs (87.5 mg total) by mouth 2 (two) times daily. Please do not take dose for evening of March 6 and morning of March 7 then resume dose   polyethylene glycol (MIRALAX / GLYCOLAX) packet Take 17 g by mouth daily as needed for mild constipation or moderate constipation.    ranitidine (ZANTAC) 150 MG/10ML syrup Take 112.5 mg  by mouth 2 (two) times daily.   simethicone (MYLICON) 80 MG chewable tablet Chew 80 mg by mouth as needed for flatulence.   traMADol (ULTRAM) 50 MG tablet Take 1 tablet (50 mg total) by mouth every 6 (six) hours as needed.   warfarin (COUMADIN) 2 MG tablet Take 1 tablet (2 mg total) by mouth daily at 6 PM.   No facility-administered encounter medications on file as of 05/01/2018.     PHYSICAL EXAM:  Deferred due to Telehealth visit  Bleu Minerd Ihor Gully, NP

## 2018-05-01 NOTE — Telephone Encounter (Signed)
  Patient contacted for Palliative Care visit.  Due to the current COVID-19 infection/crises, the patient and family prefer, and have given their verbal consent for, a provider visit via telemedicine. HIPPA policies of confidentially were discussed and patient/family expressed understanding.  Visit scheduled for 05/01/2018

## 2018-05-04 LAB — CULTURE, BLOOD (ROUTINE X 2)
CULTURE: NO GROWTH
CULTURE: NO GROWTH

## 2018-06-02 DEATH — deceased

## 2019-09-19 IMAGING — CT CT ANGIOGRAPHY CHEST
2 of 7 series · 18 of 46 positions shown · IV contrast (APPLIED)
Comparison: Radiograph earlier this day.

CLINICAL DATA: Tachypnea.

EXAM:
CT ANGIOGRAPHY CHEST WITH CONTRAST
TECHNIQUE: Multidetector CT imaging of the chest was performed using the
standard protocol during bolus administration of intravenous
contrast. Multiplanar CT image reconstructions and MIPs were
obtained to evaluate the vascular anatomy.
CONTRAST:  50mL OMNIPAQUE IOHEXOL 350 MG/ML SOLN

[Series 5: thins · axial · 0.57mm/px · z∈[-917,-720]mm · 15 of 223 slices shown]
[im 13/223  lung]
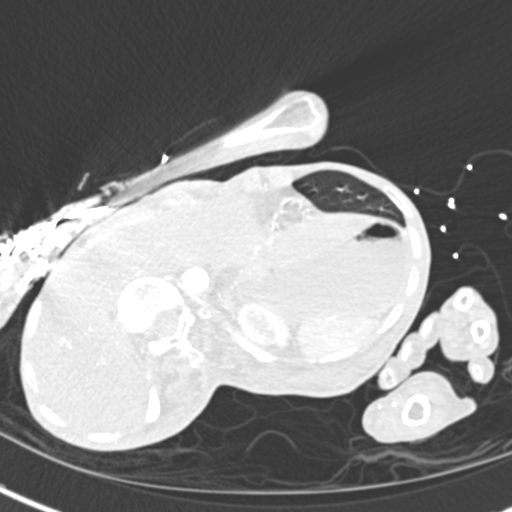
[im 25/223  soft-tissue]
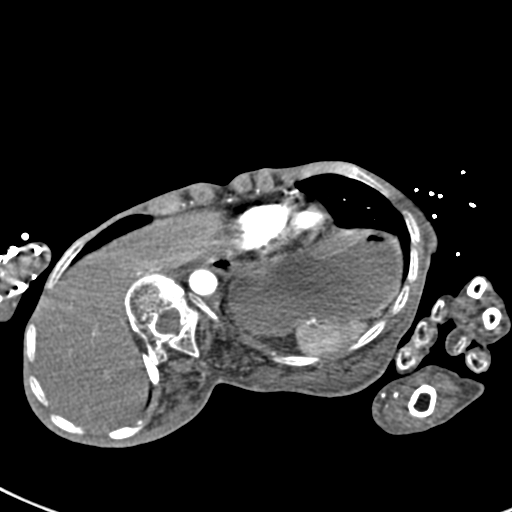
[im 38/223  lung]
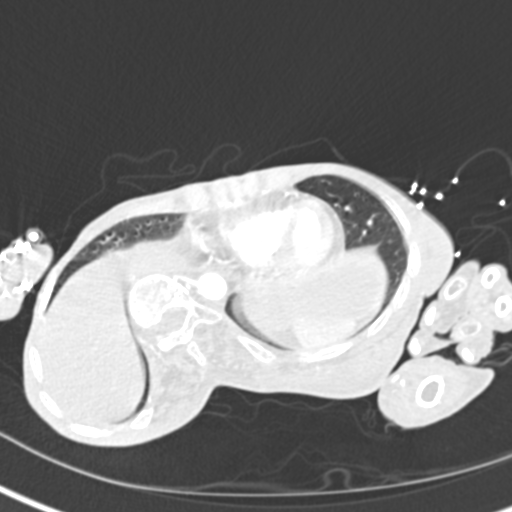
[im 50/223  soft-tissue]
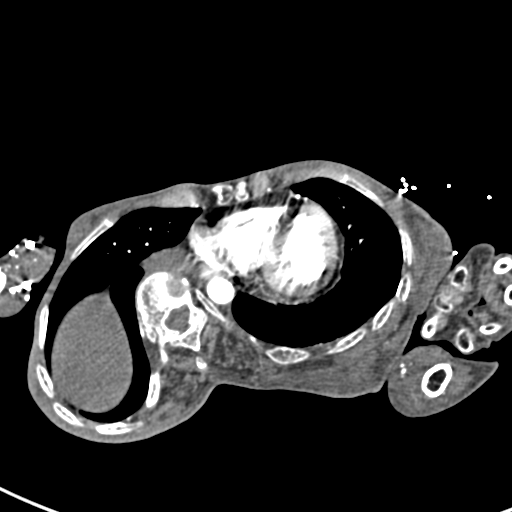
[im 75/223  lung]
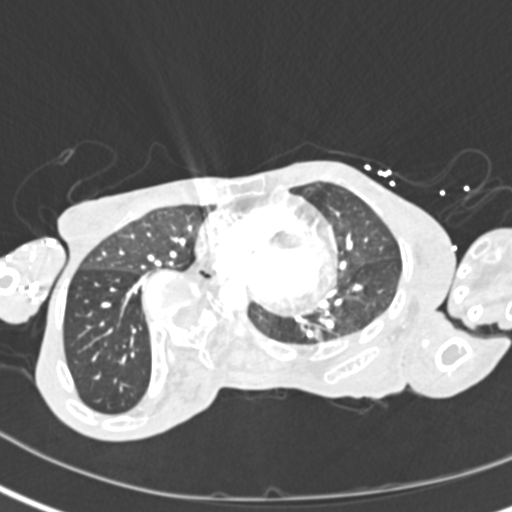
[im 87/223  soft-tissue]
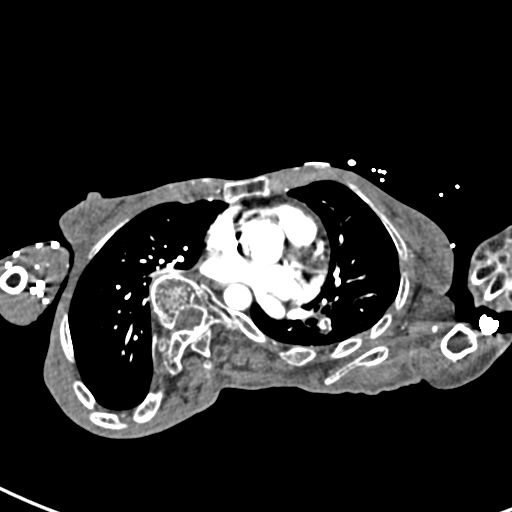
[im 99/223  lung]
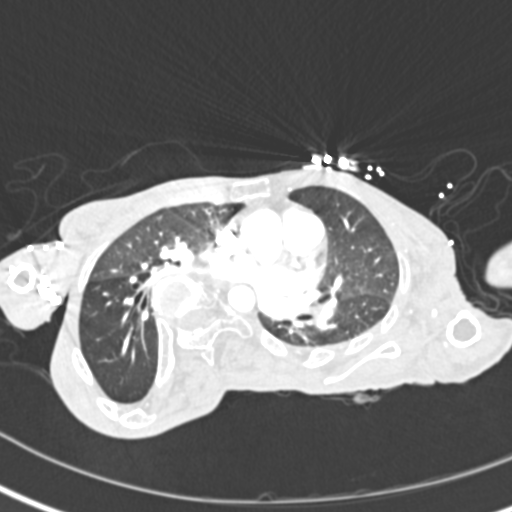
[im 112/223  soft-tissue]
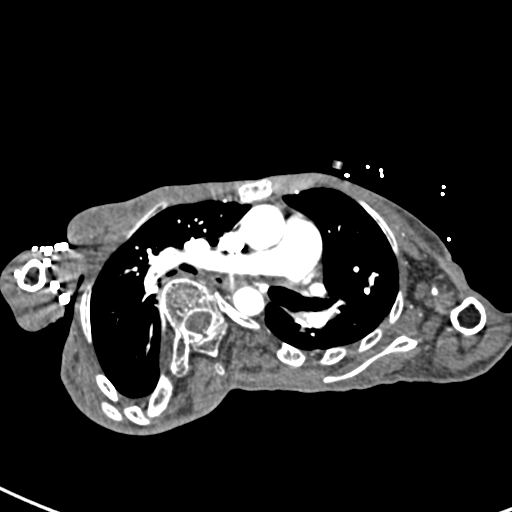
[im 124/223  lung]
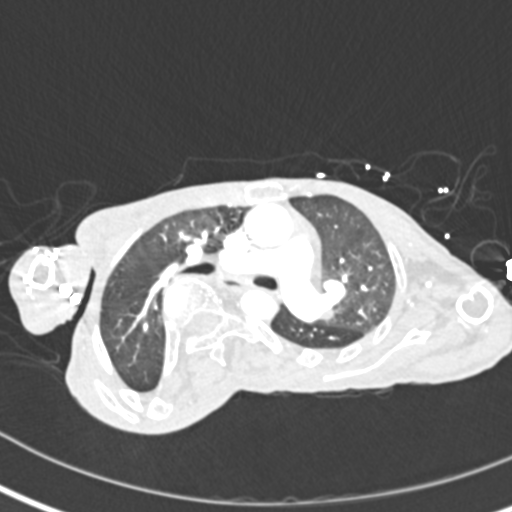
[im 136/223  soft-tissue]
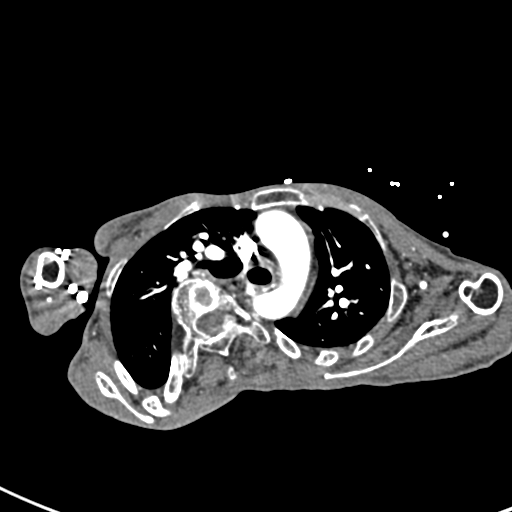
[im 149/223  lung]
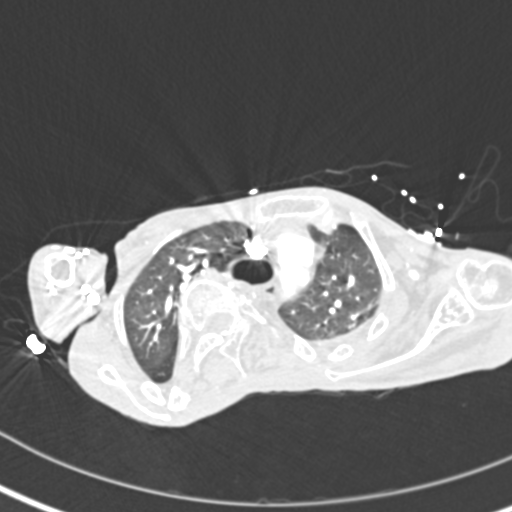
[im 173/223  soft-tissue]
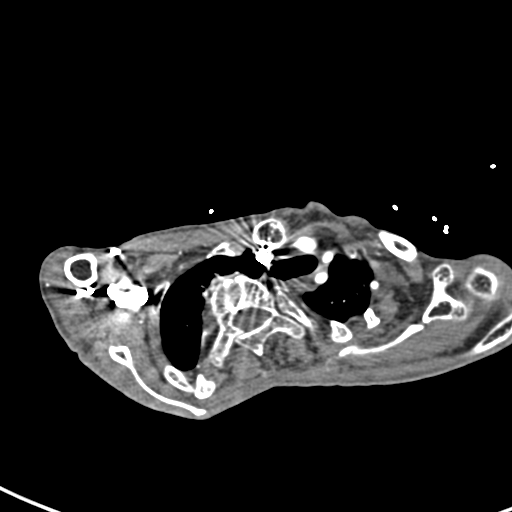
[im 186/223  lung]
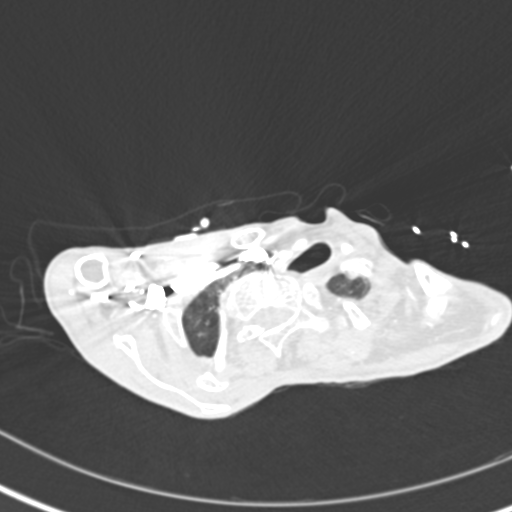
[im 198/223  soft-tissue]
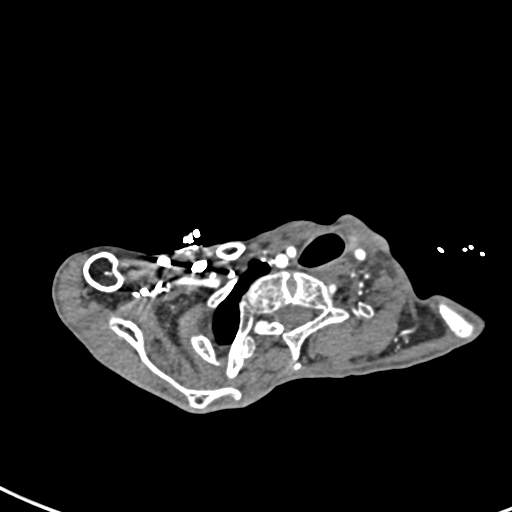
[im 210/223  lung]
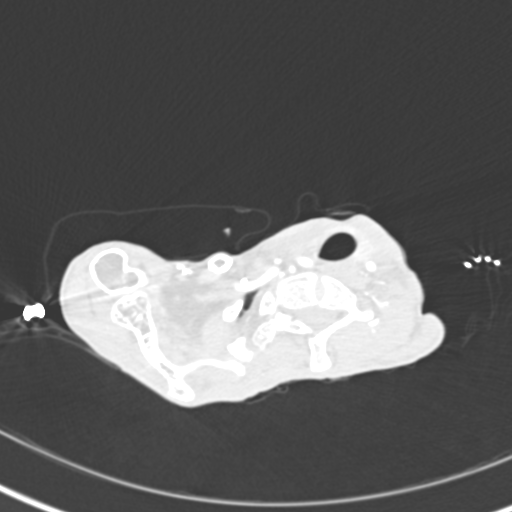

[Series 7: coronal mpr · coronal · 0.49mm/px · 3 of 52 slices shown]
[im 13/52  soft-tissue]
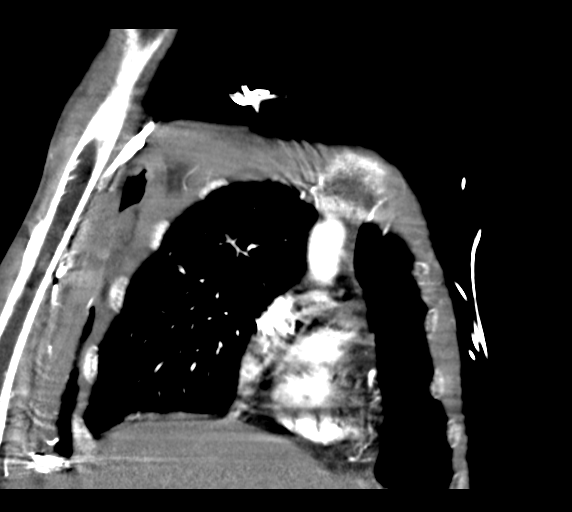
[im 26/52  soft-tissue]
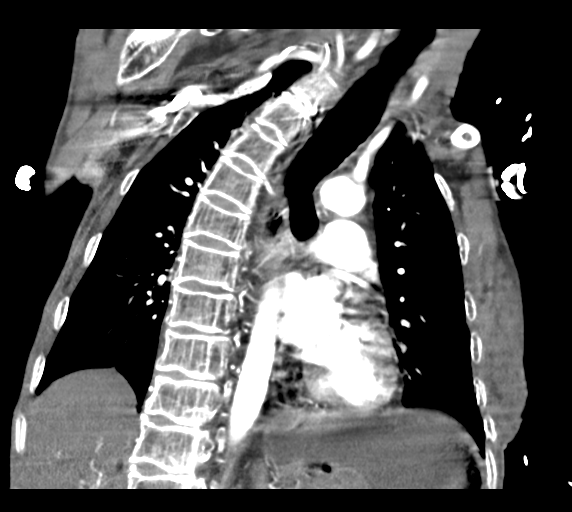
[im 39/52  soft-tissue]
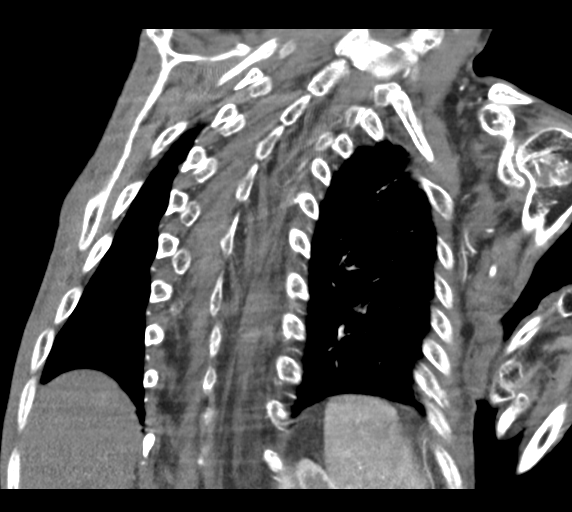

[18 of 46 positions shown; findings below may reference images not displayed]

FINDINGS: Cardiovascular: Examination is positive for small bilateral
pulmonary emboli in the lower lobes. Small filling defects in the
distal right lower lobe lobar branch. Subsegmental filling defects
in the left lower lobe. Thromboembolic burden is small. No evidence
of right heart strain, RV to LV ratio is less than 1. Severe
scoliosis distorts normal anatomy. Thoracic aorta is normal in
caliber. No aortic dissection. Heart is normal in size. No
pericardial effusion.

Mediastinum/Nodes: No enlarged mediastinal or hilar lymph nodes.
Decompressed esophagus. No dominant thyroid nodule, thyroid gland
not well seen.

Lungs/Pleura: Low lung volumes. Mild left lower lobe bronchial
thickening. No focal airspace disease or pleural effusion. No
pulmonary edema. No evidence of pulmonary infarct.

Upper Abdomen: Fluid-filled stomach is partially included.

Musculoskeletal: Severe scoliotic curvature of the spine.
Klever thorax consistent with cerebral palsy. There is
paraspinal muscle atrophy.

Review of the MIP images confirms the above findings.
IMPRESSION: 1. Positive for small bilateral lower lobe pulmonary emboli.
Thromboembolic burden is small. No right heart strain or pulmonary
infarct.
2. Low lung volumes with left lower lobe bronchial thickening.
3. Severe scoliotic curvature of the spine.

Critical Value/emergent results were called by telephone at the time
of interpretation on 04/29/2018 at [DATE] to Dr. Elhacen, who verbally
acknowledged these results.
